# Patient Record
Sex: Female | Born: 1975 | Hispanic: No | Marital: Married | Smoking: Never smoker
Health system: Southern US, Community
[De-identification: ages and names within clinical notes are randomized; demographics above are authoritative.]

## PROBLEM LIST (undated history)

## (undated) DIAGNOSIS — D1779 Benign lipomatous neoplasm of other sites: Secondary | ICD-10-CM

## (undated) DIAGNOSIS — N760 Acute vaginitis: Secondary | ICD-10-CM

## (undated) DIAGNOSIS — M545 Low back pain, unspecified: Secondary | ICD-10-CM

## (undated) DIAGNOSIS — B9689 Other specified bacterial agents as the cause of diseases classified elsewhere: Secondary | ICD-10-CM

## (undated) HISTORY — DX: Benign lipomatous neoplasm of other sites: D17.79

## (undated) HISTORY — DX: Low back pain: M54.5

## (undated) HISTORY — DX: Low back pain, unspecified: M54.50

## (undated) HISTORY — PX: WISDOM TOOTH EXTRACTION: SHX21

## (undated) HISTORY — PX: DILATION AND CURETTAGE OF UTERUS: SHX78

---

## 2010-06-17 HISTORY — PX: OTHER SURGICAL HISTORY: SHX169

## 2012-01-28 ENCOUNTER — Encounter (INDEPENDENT_AMBULATORY_CARE_PROVIDER_SITE_OTHER): Payer: Self-pay | Admitting: Surgery

## 2012-02-04 ENCOUNTER — Ambulatory Visit (INDEPENDENT_AMBULATORY_CARE_PROVIDER_SITE_OTHER): Payer: BC Managed Care – PPO | Admitting: Surgery

## 2012-02-04 ENCOUNTER — Encounter (INDEPENDENT_AMBULATORY_CARE_PROVIDER_SITE_OTHER): Payer: Self-pay | Admitting: Surgery

## 2012-02-04 VITALS — BP 92/84 | HR 68 | Temp 98.8°F | Resp 14 | Ht 66.0 in | Wt 144.8 lb

## 2012-02-04 DIAGNOSIS — R222 Localized swelling, mass and lump, trunk: Secondary | ICD-10-CM

## 2012-02-04 DIAGNOSIS — R229 Localized swelling, mass and lump, unspecified: Secondary | ICD-10-CM

## 2012-02-04 NOTE — Progress Notes (Signed)
Patient ID: Tricia Davis, female   DOB: 1976-07-03, 36 y.o.   MRN: [id number]  Chief Complaint  Patient presents with  . Other    back mass    HPI Tricia Davis is a 36 y.o. female.  This is a pleasant female referred by Dr. Manus Gunning for evaluation of a mass on her left upper back. She reports no masses been there for almost 2 years but is now getting larger and causing him to have a significant amount of discomfort. It is difficult l mass secondary to its size. She is also having back pain from the mass. HPI  Past Medical History  Diagnosis Date  . Lipoma of other specified sites   . Lumbago     Past Surgical History  Procedure Date  . Cesarean section 02/09/2011  . Dnc 06/2010    Family History  Problem Relation Age of Onset  . Hypertension Mother     Social History History  Substance Use Topics  . Smoking status: Never Smoker   . Smokeless tobacco: Not on file  . Alcohol Use: No    No Known Allergies  Current Outpatient Prescriptions  Medication Sig Dispense Refill  . Multiple Vitamins-Minerals (MULTIVITAMIN WITH MINERALS) tablet Take 1 tablet by mouth daily.        Review of Systems Review of Systems  Constitutional: Negative for fever, chills and unexpected weight change.  HENT: Negative for hearing loss, congestion, sore throat, trouble swallowing and voice change.   Eyes: Negative for visual disturbance.  Respiratory: Negative for cough and wheezing.   Cardiovascular: Negative for chest pain, palpitations and leg swelling.  Gastrointestinal: Negative for nausea, vomiting, abdominal pain, diarrhea, constipation, blood in stool, abdominal distention and anal bleeding.  Genitourinary: Negative for hematuria, vaginal bleeding and difficulty urinating.  Musculoskeletal: Positive for back pain. Negative for arthralgias.  Skin: Negative for rash and wound.  Neurological: Negative for seizures, syncope and headaches.  Hematological: Negative for  adenopathy. Does not bruise/bleed easily.  Psychiatric/Behavioral: Negative for confusion.    Blood pressure 92/84, pulse 68, temperature 98.8 F (37.1 C), temperature source Temporal, resp. rate 14, height 5\' 6"  (1.676 m), weight 144 lb 12.8 oz (65.681 kg).  Physical Exam Physical Exam  Constitutional: She is oriented to person, place, and time. She appears well-developed and well-nourished. No distress.  HENT:  Head: Normocephalic and atraumatic.  Right Ear: External ear normal.  Left Ear: External ear normal.  Nose: Nose normal.  Mouth/Throat: Oropharynx is clear and moist. No oropharyngeal exudate.  Eyes: Conjunctivae are normal. Pupils are equal, round, and reactive to light. Right eye exhibits no discharge. Left eye exhibits no discharge.  Neck: Normal range of motion. Neck supple. No tracheal deviation present. No thyromegaly present.  Cardiovascular: Normal rate, regular rhythm, normal heart sounds and intact distal pulses.   No murmur heard. Pulmonary/Chest: Breath sounds normal. No respiratory distress. She has no wheezes.  Lymphadenopathy:    She has no cervical adenopathy.  Neurological: She is alert and oriented to person, place, and time.  Skin: Skin is warm and dry. She is not diaphoretic.       There is a 4 cm mass on her upper back to the left of the midline. It is soft and slightly mobile with no erythema. It is slightly tender  Psychiatric: Her behavior is normal. Judgment normal.    Data Reviewed   Assessment    A 4 cm back mass of uncertain etiology    Plan  Removal of the left upper back mass is recommended for both histologic evaluation and control of symptoms. This may represent a lipoma although a desmoid tumor or some other malignancy cannot be ruled out. I discussed this with her in detail. I discussed the risk of surgery which includes but is not limited to bleeding, infection, recurrence, seroma formation, etc. She understands and wishes to  proceed. Likelihood of success is good       Corwyn Vora A 02/04/2012, 4:25 PM

## 2012-02-18 ENCOUNTER — Ambulatory Visit (INDEPENDENT_AMBULATORY_CARE_PROVIDER_SITE_OTHER): Payer: Self-pay | Admitting: General Surgery

## 2012-02-19 ENCOUNTER — Ambulatory Visit (INDEPENDENT_AMBULATORY_CARE_PROVIDER_SITE_OTHER): Payer: Self-pay | Admitting: Surgery

## 2012-03-25 NOTE — H&P (Signed)
Tricia Davis is an 36 y.o. female. Presents for dilation and curettage for nonviable first trimester pregnancy.  Seen in the office where sono revealed a gestational sac c/w 9 weeks and 3 days.  No evidence of fetal pole or yolk sac noted.  History of 3 prior pregnancy loses.  Work up reveal uterine septum that was resected.  Pertinent Gynecological History: Mense DES exposure: denies Blood transfusions: none Sexually transmitted diseases: no past history Previous GYN Procedures: DNC    OB History: G4, P1   Menstrual History: Menarche age: unknown LMP 01/28/12    Past Medical History  Diagnosis Date  . Lipoma of other specified sites   . Lumbago     Past Surgical History  Procedure Date  . Cesarean section 02/09/2011  . Dnc 06/2010    Family History  Problem Relation Age of Onset  . Hypertension Mother     Social History:  reports that she has never smoked. She does not have any smokeless tobacco history on file. She reports that she does not drink alcohol or use illicit drugs.  Allergies: No Known Allergies  No prescriptions prior to admission    Review of Systems  All other systems reviewed and are negative.    There were no vitals taken for this visit. Physical Exam  Constitutional: She is oriented to person, place, and time. She appears well-developed and well-nourished.  HENT:  Head: Normocephalic.  Eyes: Conjunctivae and EOM are normal. Pupils are equal, round, and reactive to light.  Neck: Normal range of motion. Neck supple.  Cardiovascular: Normal rate, regular rhythm and normal heart sounds.   Respiratory: Effort normal and breath sounds normal.  GI: Soft. Bowel sounds are normal.  Genitourinary:       Uterus c/w 9 weeks brownish discharge noted  Musculoskeletal: Normal range of motion.  Neurological: She is alert and oriented to person, place, and time.    No results found for this or any previous visit (from the past 24 hour(s)).  No  results found.  Assessment/Plan: Nonviable first trimester pregnancy procede with dilation and curettage.  Risk discussed.  Including infection.  Hemorrhage that could require transfusions with risks of aids and hepatitis.  Excessive bleeding could require hysterectomy.  Risk of perforation with injury to adjacent organs requiring exploratory surgery.  Risks of DVT and PE>  Aram Domzalski S 03/25/2012, 6:55 AM

## 2012-03-28 ENCOUNTER — Ambulatory Visit (HOSPITAL_COMMUNITY): Payer: BC Managed Care – PPO

## 2012-03-28 ENCOUNTER — Ambulatory Visit (HOSPITAL_COMMUNITY)
Admission: RE | Admit: 2012-03-28 | Discharge: 2012-03-28 | Disposition: A | Discharge status: Home / Self Care | Payer: BC Managed Care – PPO | Source: Ambulatory Visit | Attending: Obstetrics and Gynecology | Admitting: Obstetrics and Gynecology

## 2012-03-28 ENCOUNTER — Encounter (HOSPITAL_COMMUNITY): Payer: Self-pay | Admitting: *Deleted

## 2012-03-28 ENCOUNTER — Encounter (HOSPITAL_COMMUNITY)
Admission: RE | Disposition: A | Discharge status: Home / Self Care | Payer: Self-pay | Source: Ambulatory Visit | Attending: Obstetrics and Gynecology

## 2012-03-28 ENCOUNTER — Encounter (HOSPITAL_COMMUNITY): Payer: Self-pay | Admitting: Registered Nurse

## 2012-03-28 ENCOUNTER — Ambulatory Visit (HOSPITAL_COMMUNITY): Payer: BC Managed Care – PPO | Admitting: Registered Nurse

## 2012-03-28 DIAGNOSIS — R222 Localized swelling, mass and lump, trunk: Secondary | ICD-10-CM

## 2012-03-28 DIAGNOSIS — O034 Incomplete spontaneous abortion without complication: Secondary | ICD-10-CM | POA: Insufficient documentation

## 2012-03-28 DIAGNOSIS — O020 Blighted ovum and nonhydatidiform mole: Secondary | ICD-10-CM

## 2012-03-28 HISTORY — PX: DILATION AND EVACUATION: SHX1459

## 2012-03-28 LAB — CBC
Platelets: 201 10*3/uL (ref 150–400)
RBC: 4.25 MIL/uL (ref 3.87–5.11)
WBC: 9.1 10*3/uL (ref 4.0–10.5)

## 2012-03-28 LAB — ABO/RH: ABO/RH(D): A POS

## 2012-03-28 IMAGING — US US OB COMP LESS 14 WK
1 series · 14 of 28 positions shown · non-contrast
Comparison: none

[Series 1: us ob transvaginal · 39 acquisitions, 14 frames shown]
[im 2/39]
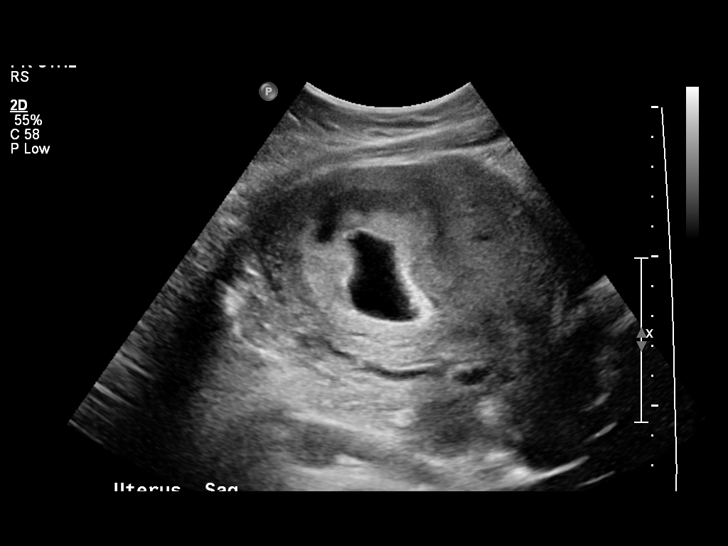
[im 5/39]
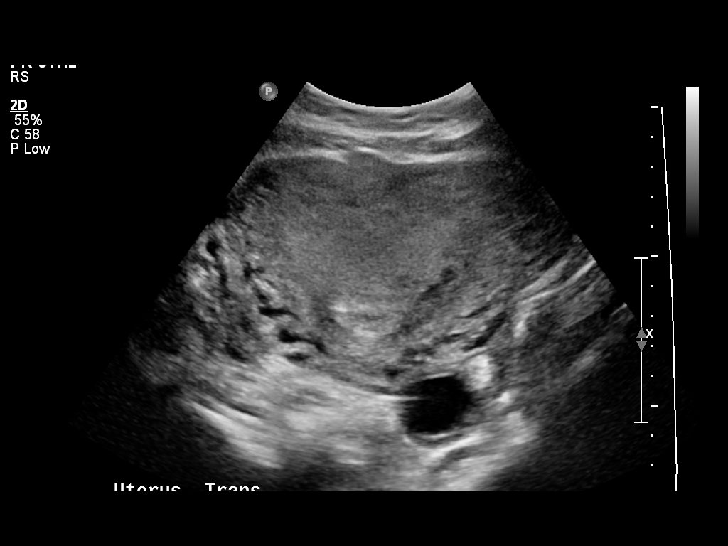
[im 8/39]
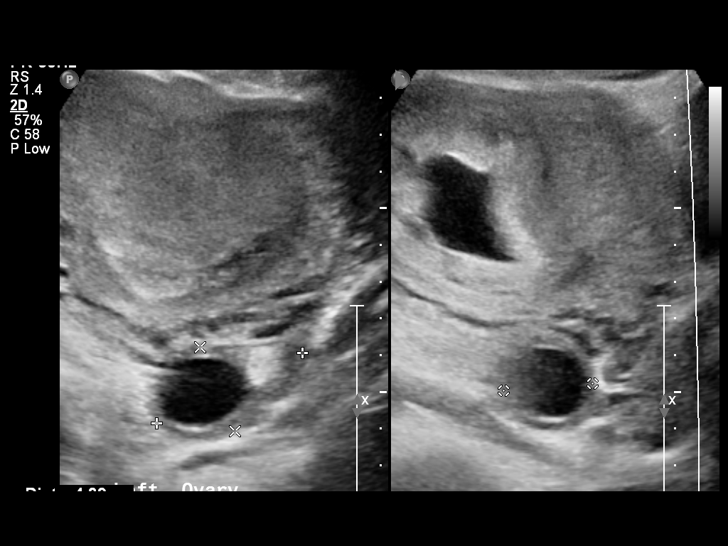
[im 10/39]
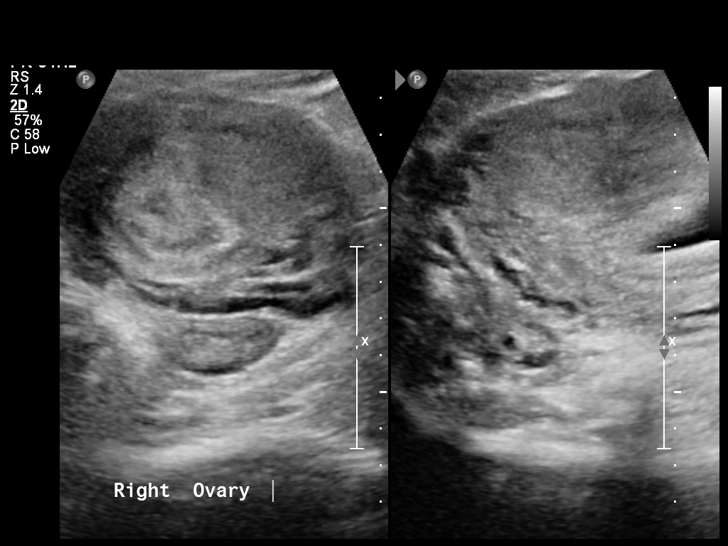
[im 13/39]
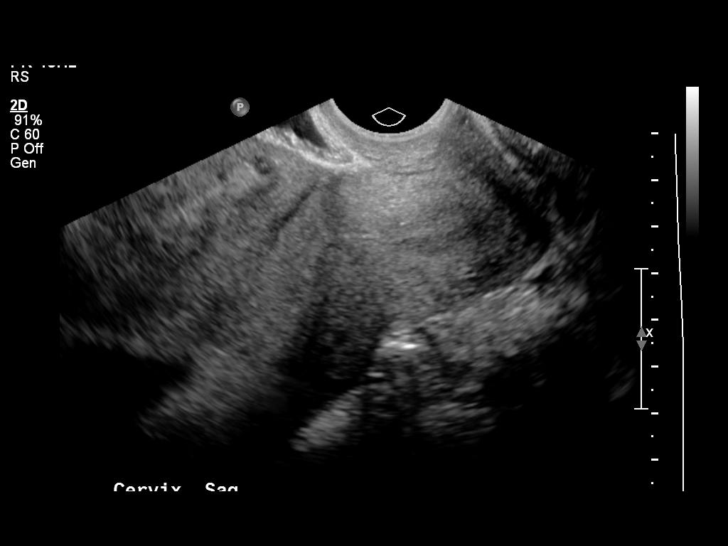
[im 16/39]
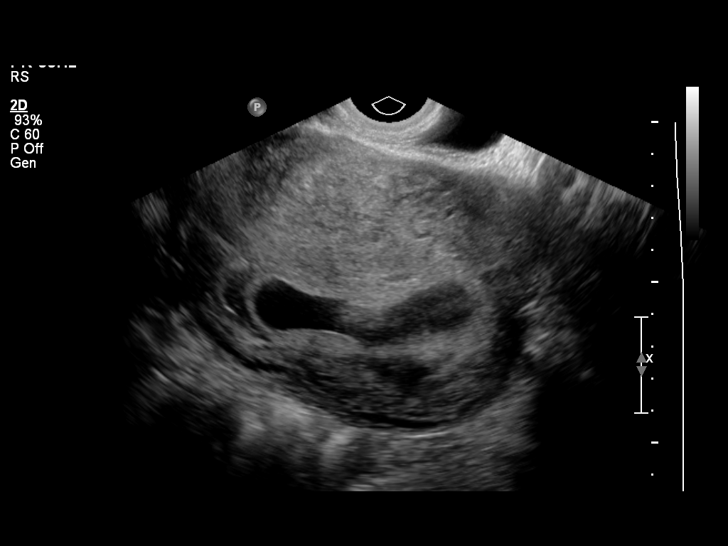
[im 19/39]
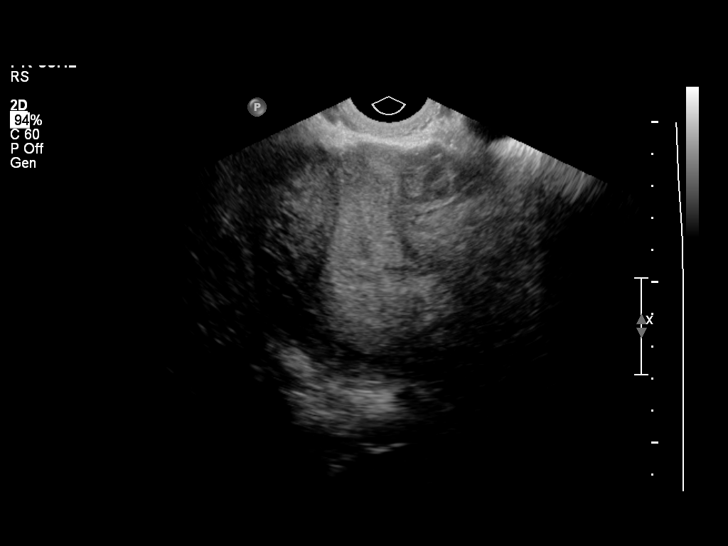
[im 22/39]
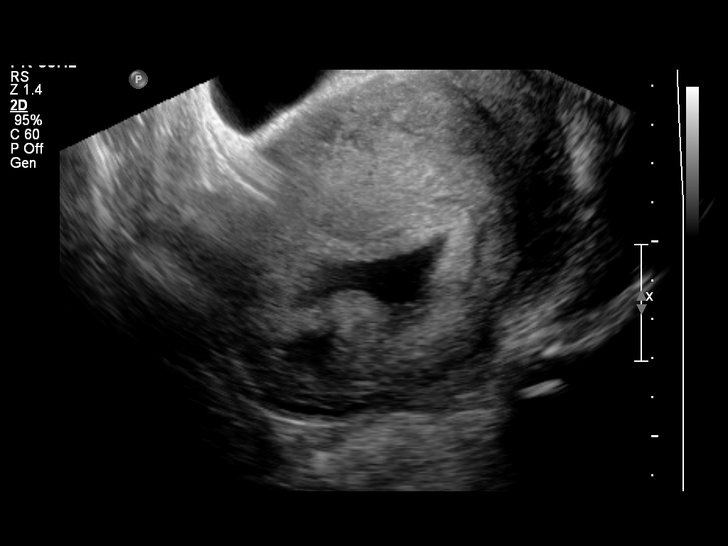
[im 24/39]
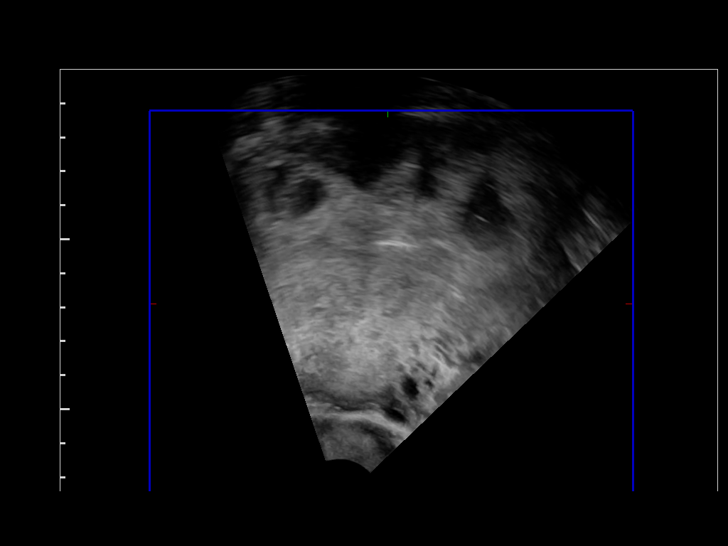
[im 27/39]
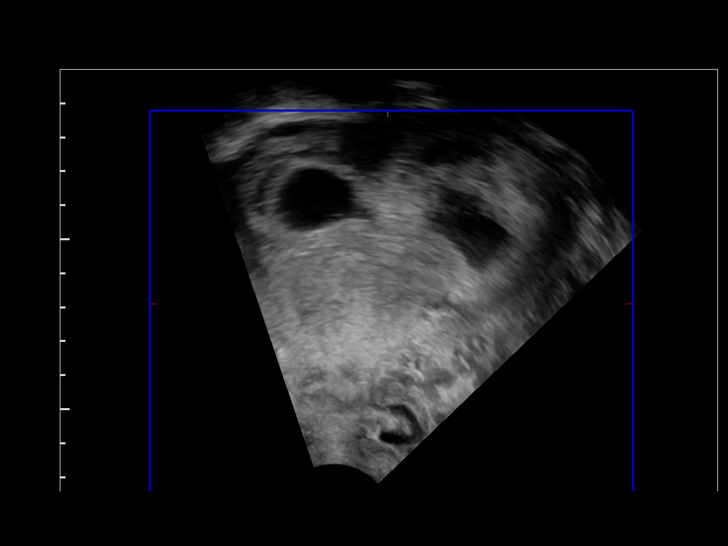
[im 30/39]
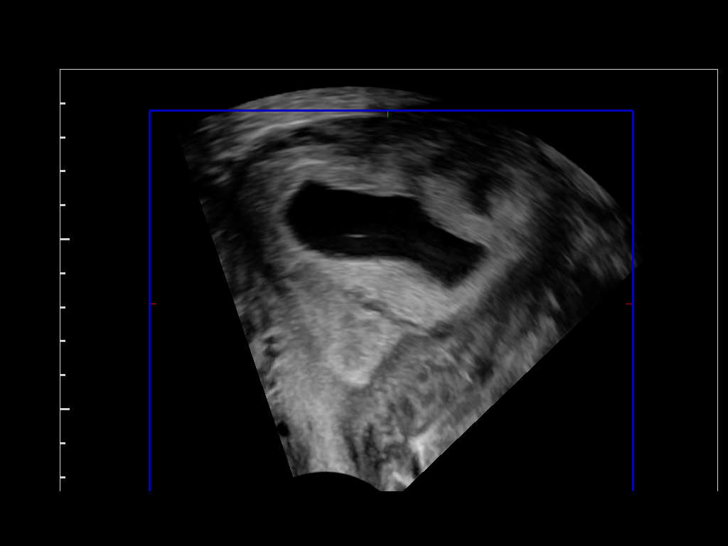
[im 33/39]
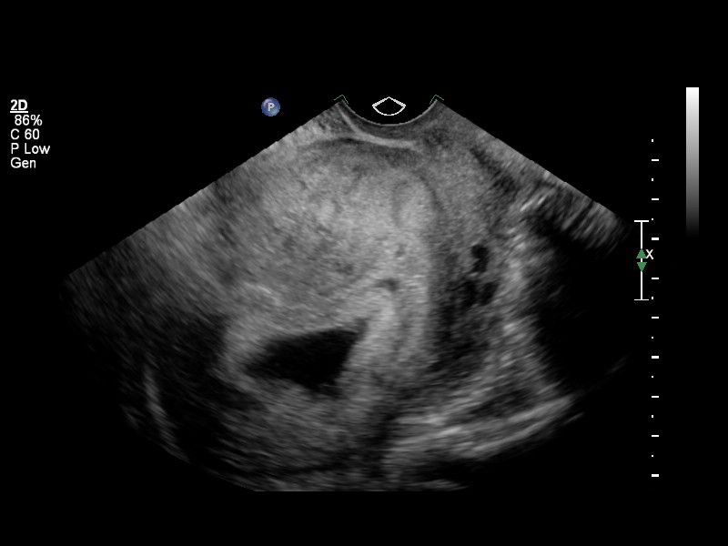
[im 36/39]
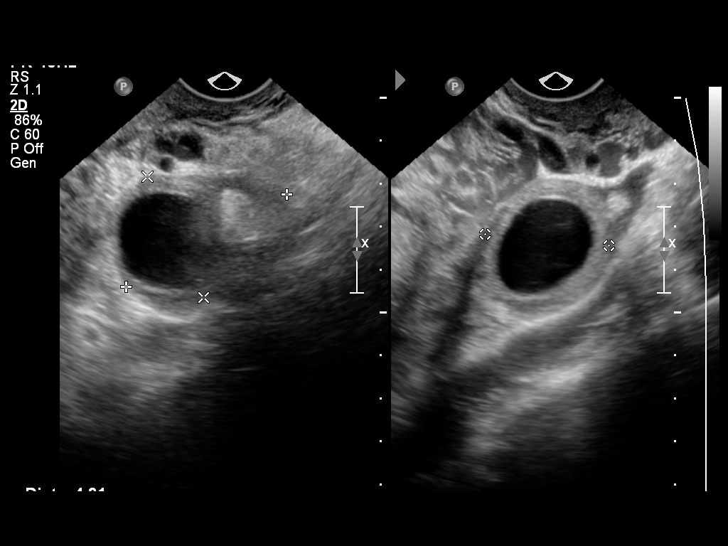
[im 39/39]
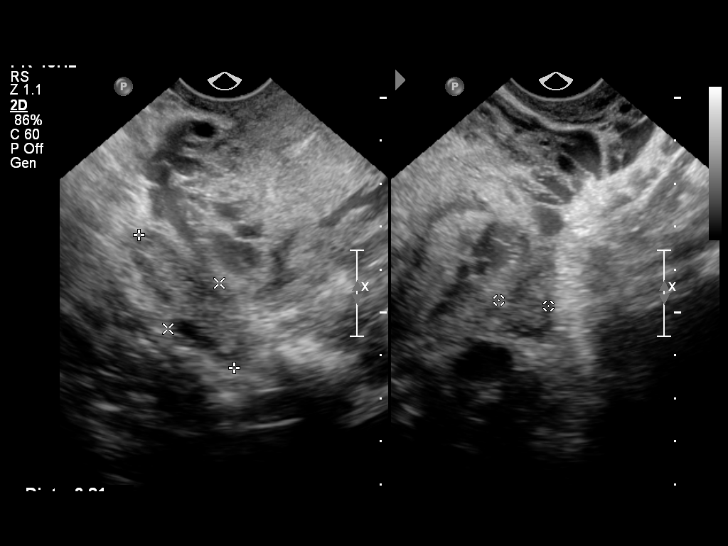

[14 of 28 positions shown; findings below may reference images not displayed]

OBSTETRICS REPORT
                      (Signed Final [DATE] [DATE])

 Order#:         [PHONE_NUMBER]_O,[4O]
                 36_O
Procedures

 US OB TRANSVAGINAL                                    76817.0
 US OB COMP LESS 14 WKS                                76801.0
Indications

 Pregnancy with inconclusive fetal viability           [4O]
 Previous cesarean section
Fetal Evaluation

 Preg. Location:    Intrauterine
 Gest. Sac:         Intrauterine
 Yolk Sac:          Not visualized
 Fetal Pole:        Not visualized
 Cardiac Activity:  No embryo visualized
Biometry

 GS:        33  mm    G. Age:   9w 1d                  EDD:   [DATE]
Cervix Uterus Adnexa

 Cervix:       Normal appearance by transvaginal scan
 Uterus:       Irregular IUGS seen.
 Cul De Sac:   No free fluid seen.

 Left Ovary:   Small echogenic focus, probable small dermoid.
 Right Ovary:  Within normal limits.
 Adnexa:     No abnormality visualized.
Impression

 Irregular gestational sac with small amount of internal debris
 but no yolk sac or fetal pole with MSD of 33mm. Findings are
 confirmatory for a blighted ovum.

 Probable small left ovarian dermoid.

## 2012-03-28 SURGERY — DILATION AND EVACUATION, UTERUS
Anesthesia: Monitor Anesthesia Care | Site: Uterus | Wound class: Clean Contaminated

## 2012-03-28 MED ORDER — FENTANYL CITRATE 0.05 MG/ML IJ SOLN
INTRAMUSCULAR | Status: DC | PRN
  Administered 2012-03-28: 100 ug via INTRAVENOUS

## 2012-03-28 MED ORDER — LACTATED RINGERS IV SOLN
INTRAVENOUS | Status: DC | PRN
  Administered 2012-03-28 (×2): via INTRAVENOUS

## 2012-03-28 MED ORDER — DEXAMETHASONE SODIUM PHOSPHATE 10 MG/ML IJ SOLN
INTRAMUSCULAR | Status: AC
  Filled 2012-03-28: qty 1

## 2012-03-28 MED ORDER — FENTANYL CITRATE 0.05 MG/ML IJ SOLN
INTRAMUSCULAR | Status: AC
  Filled 2012-03-28: qty 2

## 2012-03-28 MED ORDER — MIDAZOLAM HCL 2 MG/2ML IJ SOLN: 0.5000 mg | Freq: Once | INTRAMUSCULAR | Status: DC | PRN

## 2012-03-28 MED ORDER — CEFAZOLIN SODIUM-DEXTROSE 2-3 GM-% IV SOLR
INTRAVENOUS | Status: AC
  Administered 2012-03-28: 2 g via INTRAVENOUS
  Filled 2012-03-28: qty 50

## 2012-03-28 MED ORDER — PROPOFOL 10 MG/ML IV EMUL
INTRAVENOUS | Status: DC | PRN
  Administered 2012-03-28: 100 ug/kg/min via INTRAVENOUS

## 2012-03-28 MED ORDER — FENTANYL CITRATE 0.05 MG/ML IJ SOLN: 25.0000 ug | INTRAMUSCULAR | Status: DC | PRN

## 2012-03-28 MED ORDER — LIDOCAINE HCL (CARDIAC) 20 MG/ML IV SOLN
INTRAVENOUS | Status: DC | PRN
  Administered 2012-03-28: 60 mg via INTRAVENOUS

## 2012-03-28 MED ORDER — KETOROLAC TROMETHAMINE 30 MG/ML IJ SOLN: 15.0000 mg | Freq: Once | INTRAMUSCULAR | Status: DC | PRN

## 2012-03-28 MED ORDER — CEFAZOLIN SODIUM-DEXTROSE 2-3 GM-% IV SOLR: 2.0000 g | INTRAVENOUS | Status: DC

## 2012-03-28 MED ORDER — CHLOROPROCAINE HCL 1 % IJ SOLN
INTRAMUSCULAR | Status: AC
  Filled 2012-03-28: qty 30

## 2012-03-28 MED ORDER — ONDANSETRON HCL 4 MG/2ML IJ SOLN: 4.0000 mg | Freq: Once | INTRAMUSCULAR | Status: DC

## 2012-03-28 MED ORDER — MIDAZOLAM HCL 2 MG/2ML IJ SOLN
INTRAMUSCULAR | Status: AC
  Filled 2012-03-28: qty 2

## 2012-03-28 MED ORDER — LACTATED RINGERS IV SOLN: INTRAVENOUS | Status: DC

## 2012-03-28 MED ORDER — ACETAMINOPHEN 325 MG PO TABS: 325.0000 mg | ORAL_TABLET | ORAL | Status: DC | PRN

## 2012-03-28 MED ORDER — ONDANSETRON HCL 4 MG/2ML IJ SOLN
INTRAMUSCULAR | Status: AC
  Administered 2012-03-28: 4 mg via INTRAVENOUS
  Filled 2012-03-28: qty 2

## 2012-03-28 MED ORDER — KETOROLAC TROMETHAMINE 30 MG/ML IJ SOLN
INTRAMUSCULAR | Status: DC | PRN
  Administered 2012-03-28: 30 mg via INTRAVENOUS

## 2012-03-28 MED ORDER — MEPERIDINE HCL 25 MG/ML IJ SOLN: 6.2500 mg | INTRAMUSCULAR | Status: DC | PRN

## 2012-03-28 MED ORDER — GLYCOPYRROLATE 0.2 MG/ML IJ SOLN
INTRAMUSCULAR | Status: AC
  Filled 2012-03-28: qty 1

## 2012-03-28 MED ORDER — PROPOFOL 10 MG/ML IV EMUL
INTRAVENOUS | Status: AC
  Filled 2012-03-28: qty 40

## 2012-03-28 MED ORDER — DEXAMETHASONE SODIUM PHOSPHATE 10 MG/ML IJ SOLN
INTRAMUSCULAR | Status: DC | PRN
  Administered 2012-03-28: 10 mg via INTRAVENOUS

## 2012-03-28 MED ORDER — PROMETHAZINE HCL 25 MG/ML IJ SOLN: 6.2500 mg | INTRAMUSCULAR | Status: DC | PRN

## 2012-03-28 MED ORDER — ONDANSETRON HCL 4 MG/2ML IJ SOLN
INTRAMUSCULAR | Status: DC | PRN
  Administered 2012-03-28: 4 mg via INTRAVENOUS

## 2012-03-28 MED ORDER — ALBUTEROL SULFATE HFA 108 (90 BASE) MCG/ACT IN AERS
INHALATION_SPRAY | RESPIRATORY_TRACT | Status: AC
  Filled 2012-03-28: qty 6.7

## 2012-03-28 MED ORDER — LACTATED RINGERS IV SOLN
INTRAVENOUS | Status: DC
  Administered 2012-03-28: 125 mL/h via INTRAVENOUS

## 2012-03-28 MED ORDER — PROPOFOL 10 MG/ML IV EMUL
INTRAVENOUS | Status: DC | PRN
  Administered 2012-03-28: 30 mg via INTRAVENOUS
  Administered 2012-03-28: 20 mg via INTRAVENOUS
  Administered 2012-03-28: 30 mg via INTRAVENOUS

## 2012-03-28 MED ORDER — ONDANSETRON HCL 4 MG/2ML IJ SOLN
INTRAMUSCULAR | Status: AC
  Filled 2012-03-28: qty 2

## 2012-03-28 MED ORDER — MIDAZOLAM HCL 5 MG/5ML IJ SOLN
INTRAMUSCULAR | Status: DC | PRN
  Administered 2012-03-28: 2 mg via INTRAVENOUS

## 2012-03-28 MED ORDER — CHLOROPROCAINE HCL 1 % IJ SOLN
INTRAMUSCULAR | Status: DC | PRN
  Administered 2012-03-28: 30 mL

## 2012-03-28 MED ORDER — LIDOCAINE HCL (CARDIAC) 20 MG/ML IV SOLN
INTRAVENOUS | Status: AC
  Filled 2012-03-28: qty 5

## 2012-03-28 SURGICAL SUPPLY — 17 items
CLOTH BEACON ORANGE TIMEOUT ST (SAFETY) ×2 IMPLANT
DECANTER SPIKE VIAL GLASS SM (MISCELLANEOUS) ×2 IMPLANT
GLOVE BIO SURGEON STRL SZ7 (GLOVE) ×2 IMPLANT
GOWN PREVENTION PLUS LG XLONG (DISPOSABLE) ×2 IMPLANT
GOWN STRL REIN XL XLG (GOWN DISPOSABLE) ×2 IMPLANT
KIT BERKELEY 1ST TRIMESTER 3/8 (MISCELLANEOUS) ×2 IMPLANT
NEEDLE SPNL 20GX3.5 QUINCKE YW (NEEDLE) ×2 IMPLANT
NS IRRIG 1000ML POUR BTL (IV SOLUTION) ×2 IMPLANT
PACK VAGINAL MINOR WOMEN LF (CUSTOM PROCEDURE TRAY) ×2 IMPLANT
PAD PREP 24X48 CUFFED NSTRL (MISCELLANEOUS) ×2 IMPLANT
SET BERKELEY SUCTION TUBING (SUCTIONS) ×2 IMPLANT
SYR CONTROL 10ML LL (SYRINGE) ×2 IMPLANT
TOWEL OR 17X24 6PK STRL BLUE (TOWEL DISPOSABLE) ×4 IMPLANT
VACURETTE 10 RIGID CVD (CANNULA) IMPLANT
VACURETTE 7MM CVD STRL WRAP (CANNULA) IMPLANT
VACURETTE 8 RIGID CVD (CANNULA) ×2 IMPLANT
VACURETTE 9 RIGID CVD (CANNULA) IMPLANT

## 2012-03-28 NOTE — Transfer of Care (Signed)
Immediate Anesthesia Transfer of Care Note  Patient: Tricia Davis  Procedure(s) Performed: Procedure(s) (LRB): DILATATION AND EVACUATION (N/A)  Patient Location: PACU  Anesthesia Type: MAC  Level of Consciousness: awake, alert  and oriented  Airway & Oxygen Therapy: Patient Spontanous Breathing and Patient connected to nasal cannula oxygen  Post-op Assessment: Report given to PACU RN and Post -op Vital signs reviewed and stable  Post vital signs: Reviewed and stable  Complications: No apparent anesthesia complications

## 2012-03-28 NOTE — Brief Op Note (Signed)
03/28/2012  8:25 AM  PATIENT:  Tricia Davis  36 y.o. female  PRE-OPERATIVE DIAGNOSIS:  non viable first trimester pregnancy  POST-OPERATIVE DIAGNOSIS:  non viable first trimester pregnancy  PROCEDURE:  Procedure(s) (LRB): DILATATION AND EVACUATION (N/A)  SURGEON:  Surgeon(s) and Role:    * Juluis Mire, MD - Primary  PHYSICIAN ASSISTANT:   ASSISTANTS: none   ANESTHESIA:   local and IV sedation  EBL:  Total I/O In: 1000 [I.V.:1000] Out: 250 [Blood:250]  BLOOD ADMINISTERED:none  DRAINS: none   LOCAL MEDICATIONS USED:  Amount: 20 ml and OTHER nesicaine 1 %  SPECIMEN:  Source of Specimen:  poc  DISPOSITION OF SPECIMEN:  PATHOLOGY  COUNTS:  YES  TOURNIQUET:  * No tourniquets in log *  DICTATION: .Other Dictation: Dictation Number [id number]  PLAN OF CARE: Discharge to home after PACU  PATIENT DISPOSITION:  PACU - hemodynamically stable.   Delay start of Pharmacological VTE agent (>24hrs) due to surgical blood loss or risk of bleeding: no

## 2012-03-28 NOTE — Op Note (Signed)
Patient name Tricia Davis, Tricia Davis DICTATION#  [id number] CSN# [id number]  [facility], MD 03/28/2012 8:26 AM

## 2012-03-28 NOTE — Anesthesia Preprocedure Evaluation (Addendum)
Anesthesia Evaluation  Patient identified by MRN, date of birth, ID band Patient awake    Reviewed: Allergy & Precautions, H&P , Patient's Chart, lab work & pertinent test results, reviewed documented beta blocker date and time   History of Anesthesia Complications Negative for: history of anesthetic complications  Airway Mallampati: II TM Distance: >3 FB Neck ROM: full    Dental No notable dental hx.    Pulmonary neg pulmonary ROS,  breath sounds clear to auscultation  Pulmonary exam normal       Cardiovascular Exercise Tolerance: Good negative cardio ROS  Rhythm:regular Rate:Normal     Neuro/Psych negative neurological ROS  negative psych ROS   GI/Hepatic negative GI ROS, Neg liver ROS,   Endo/Other  negative endocrine ROS  Renal/GU negative Renal ROS     Musculoskeletal   Abdominal   Peds  Hematology negative hematology ROS (+)   Anesthesia Other Findings   Reproductive/Obstetrics negative OB ROS                           Anesthesia Physical Anesthesia Plan  ASA: I  Anesthesia Plan: MAC   Post-op Pain Management:    Induction:   Airway Management Planned:   Additional Equipment:   Intra-op Plan:   Post-operative Plan:   Informed Consent: I have reviewed the patients History and Physical, chart, labs and discussed the procedure including the risks, benefits and alternatives for the proposed anesthesia with the patient or authorized representative who has indicated his/her understanding and acceptance.   Dental Advisory Given  Plan Discussed with: CRNA, Surgeon and Anesthesiologist  Anesthesia Plan Comments:       Anesthesia Quick Evaluation  

## 2012-03-28 NOTE — Op Note (Signed)
NAME:  Tricia Davis, Tricia Davis NO.:  [id number]  MEDICAL RECORD NO.:  [id number]  LOCATION:  [facility]                          FACILITY:  [facility]  PHYSICIAN:  Juluis Mire, M.D.   DATE OF BIRTH:  01/15/1976  DATE OF PROCEDURE:  03/28/2012 DATE OF DISCHARGE:                              OPERATIVE REPORT   PREOPERATIVE DIAGNOSIS:  Nonviable first trimester pregnancy.  POSTOP DIAGNOSIS:  Nonviable first trimester pregnancy.  OPERATIVE PROCEDURE:  Paracervical block.  Dilatation and evacuation.  SURGEON:  Juluis Mire, MD  ANESTHESIA:  Paracervical block and sedation.  ESTIMATED BLOOD LOSS:  100-200 mL.  PACKS AND DRAINS:  None.  INTRAOPERATIVE BLOOD PLACED:  None.  COMPLICATIONS:  None.  INDICATION:  Dictated in history and physical.  PROCEDURE:  The patient was taken to OR, placed in supine position. After sedation, the patient was placed in dorsal lithotomy position using the Allen stirrups.  The patient was draped in sterile field.  A speculum was placed in the vaginal vault.  The cervix and vagina cleansed out with Betadine.  Anterior lip of the cervix was anesthetized with 1% Xylocaine, secured with a single-tooth tenaculum.  A 1% Nesacaine was used to perform a paracervical block, 20 mL were used. Uterus sounded approximately 11 cm.  Cervix was serially dilated to a size 29 Pratt dilator.  A size 8 curved suction curette was then introduced and intrauterine contents were evacuated using suction curetting.  This was continued until no further tissue was obtained.  We then sharply curetted, felt some smoothness, particularly to the left side.  Repeat suction curetting several times followed by sharp curetting till the uterus was contracting down well and we felt the uterus was emptied by the gritty feel in all quadrants.  There was no active bleeding.  Tissue was obtained and sent for genetic studies.  Speculum and single-tooth tenaculum removed.  The patient  taken out of the dorsal lithotomy position.  Once alert, transferred to recovery room in good condition.  Sponge, instrument, and needle count was correct by circulating nurse.     Juluis Mire, M.D.     JSM/MEDQ  D:  03/28/2012  T:  03/28/2012  Job:  [id number]

## 2012-03-28 NOTE — Anesthesia Postprocedure Evaluation (Signed)
  Anesthesia Post-op Note  Patient: Tricia Davis  Procedure(s) Performed: Procedure(s) (LRB): DILATATION AND EVACUATION (N/A)  Patient Location: PACU  Anesthesia Type: MAC  Level of Consciousness: awake, alert  and oriented  Airway and Oxygen Therapy: Patient Spontanous Breathing  Post-op Pain: none  Post-op Assessment: Post-op Vital signs reviewed, Patient's Cardiovascular Status Stable, Respiratory Function Stable, Patent Airway, No signs of Nausea or vomiting and Pain level controlled  Post-op Vital Signs: Reviewed and stable  Complications: No apparent anesthesia complications

## 2012-03-28 NOTE — H&P (Signed)
  History and physical exam unchanged 

## 2012-03-28 NOTE — Progress Notes (Signed)
Patient requested an ultrasound to be done prior to surgery.  Dr Arelia Sneddon called and obtained u/s order.  Patient left SS to U/S. Pt out of SS Dept.  Dr Sheral Apley, Juliane Lack, CNA, Kathi Ludwig RN all notified of U/S prior to surgery.

## 2012-03-31 ENCOUNTER — Encounter (HOSPITAL_COMMUNITY): Payer: Self-pay | Admitting: Obstetrics and Gynecology

## 2012-04-18 LAB — CHROMOSOME STD, POC(TISSUE)-NCBH

## 2012-09-15 ENCOUNTER — Ambulatory Visit (INDEPENDENT_AMBULATORY_CARE_PROVIDER_SITE_OTHER): Payer: BC Managed Care – PPO | Admitting: General Surgery

## 2012-09-15 ENCOUNTER — Encounter (INDEPENDENT_AMBULATORY_CARE_PROVIDER_SITE_OTHER): Payer: Self-pay | Admitting: General Surgery

## 2012-09-15 VITALS — BP 98/56 | HR 64 | Temp 98.0°F | Resp 16 | Ht 66.0 in | Wt 141.2 lb

## 2012-09-15 DIAGNOSIS — R229 Localized swelling, mass and lump, unspecified: Secondary | ICD-10-CM

## 2012-09-15 DIAGNOSIS — R222 Localized swelling, mass and lump, trunk: Secondary | ICD-10-CM

## 2012-09-15 NOTE — Progress Notes (Signed)
Subjective:     Patient ID: Tricia Davis, female   DOB: 07/25/1976, 36 y.o.   MRN: [id number]  HPI  She is here to discuss removal of a left back soft tissue mass. She saw Dr. Blackman in May of this year but became pregnant after that time and had to postpone the surgery. She is now interested in having surgery.   Review of Systems  She thinks the mass is continuing to get larger the     Objective:   Physical Exam Gen.-well-developed, well-nourished, in no acute distress.  Musculoskeletal-5.5 cm subcutaneous soft tissue mass and left upper back is present. No erythema or ecchymosis. Small scar present in the middle of the skin area.    Assessment:     Enlarging subcutaneous soft tissue mass of left upper back.    Plan:     Removal of mass under local anesthesia. We discussed the procedure, risks, and aftercare. Risks include but are not limited to bleeding, infection, wound healing problems, reaction to anesthesia. She seems to understand this and agrees with the plan.      

## 2012-09-15 NOTE — Patient Instructions (Signed)
Light activities with left arm for one week after surgery.

## 2012-09-24 ENCOUNTER — Other Ambulatory Visit: Payer: Self-pay | Admitting: Family Medicine

## 2012-09-24 DIAGNOSIS — N644 Mastodynia: Secondary | ICD-10-CM

## 2012-09-25 ENCOUNTER — Encounter (HOSPITAL_BASED_OUTPATIENT_CLINIC_OR_DEPARTMENT_OTHER)
Admission: RE | Disposition: A | Discharge status: Home / Self Care | Payer: Self-pay | Source: Ambulatory Visit | Attending: General Surgery

## 2012-09-25 ENCOUNTER — Ambulatory Visit (HOSPITAL_BASED_OUTPATIENT_CLINIC_OR_DEPARTMENT_OTHER)
Admission: RE | Admit: 2012-09-25 | Discharge: 2012-09-25 | Disposition: A | Discharge status: Home / Self Care | Payer: BC Managed Care – PPO | Source: Ambulatory Visit | Attending: General Surgery | Admitting: General Surgery

## 2012-09-25 DIAGNOSIS — D1739 Benign lipomatous neoplasm of skin and subcutaneous tissue of other sites: Secondary | ICD-10-CM

## 2012-09-25 DIAGNOSIS — M799 Soft tissue disorder, unspecified: Secondary | ICD-10-CM | POA: Insufficient documentation

## 2012-09-25 HISTORY — PX: MASS EXCISION: SHX2000

## 2012-09-25 SURGERY — MINOR EXCISION OF MASS
Anesthesia: LOCAL | Site: Back | Wound class: Clean

## 2012-09-25 MED ORDER — HYDROCODONE-ACETAMINOPHEN 5-325 MG PO TABS: 1.0000 | ORAL_TABLET | Freq: Four times a day (QID) | ORAL | Status: DC | PRN

## 2012-09-25 MED ORDER — SODIUM BICARBONATE 4 % IV SOLN
INTRAVENOUS | Status: DC | PRN
  Administered 2012-09-25: 11:00:00

## 2012-09-25 SURGICAL SUPPLY — 23 items
BENZOIN TINCTURE PRP APPL 2/3 (GAUZE/BANDAGES/DRESSINGS) ×2 IMPLANT
BLADE SURG 10 STRL SS (BLADE) IMPLANT
BLADE SURG 15 STRL LF DISP TIS (BLADE) ×1 IMPLANT
BLADE SURG 15 STRL SS (BLADE) ×1
CHLORAPREP W/TINT 26ML (MISCELLANEOUS) ×4 IMPLANT
CLOTH BEACON ORANGE TIMEOUT ST (SAFETY) ×2 IMPLANT
DRSG TEGADERM 4X4.75 (GAUZE/BANDAGES/DRESSINGS) ×2 IMPLANT
ELECT COATED BLADE 2.86 ST (ELECTRODE) IMPLANT
ELECT REM PT RETURN 9FT ADLT (ELECTROSURGICAL) ×2
ELECTRODE REM PT RTRN 9FT ADLT (ELECTROSURGICAL) ×1 IMPLANT
GAUZE SPONGE 4X4 12PLY STRL LF (GAUZE/BANDAGES/DRESSINGS) ×2 IMPLANT
GLOVE BIO SURGEON STRL SZ8 (GLOVE) ×2 IMPLANT
GLOVE BIOGEL PI IND STRL 8.5 (GLOVE) ×2 IMPLANT
GLOVE BIOGEL PI INDICATOR 8.5 (GLOVE) ×2
GLOVE ECLIPSE 8.0 STRL XLNG CF (GLOVE) ×4 IMPLANT
GOWN PREVENTION PLUS XLARGE (GOWN DISPOSABLE) ×2 IMPLANT
NEEDLE HYPO 25X1 1.5 SAFETY (NEEDLE) ×2 IMPLANT
PENCIL BUTTON HOLSTER BLD 10FT (ELECTRODE) IMPLANT
SPONGE GAUZE 4X4 12PLY (GAUZE/BANDAGES/DRESSINGS) IMPLANT
STRIP CLOSURE SKIN 1/2X4 (GAUZE/BANDAGES/DRESSINGS) ×2 IMPLANT
SUT MON AB 4-0 PC3 18 (SUTURE) ×2 IMPLANT
SUT VICRYL 3-0 CR8 SH (SUTURE) ×2 IMPLANT
SYR CONTROL 10ML LL (SYRINGE) ×2 IMPLANT

## 2012-09-25 NOTE — Interval H&P Note (Signed)
History and Physical Interval Note:  09/25/2012 9:58 AM  Tricia Davis  has presented today for surgery, with the diagnosis of soft tissue mass on back  The various methods of treatment have been discussed with the patient and family. After consideration of risks, benefits and other options for treatment, the patient has consented to  Procedure(s) (LRB) with comments: MINOR EXCISION OF MASS (N/A) - remove soft tissue mass on back 5.5 cm as a surgical intervention .  The patient's history has been reviewed, patient examined, no change in status, stable for surgery.  I have reviewed the patient's chart and labs.  Questions were answered to the patient's satisfaction.     Rajohn Henery [facility]

## 2012-09-25 NOTE — H&P (View-Only) (Signed)
Subjective:     Patient ID: Tricia Davis, female   DOB: Feb 09, 1976, 37 y.o.   MRN: [id number]  HPI  She is here to discuss removal of a left back soft tissue mass. She saw Dr. Magnus Ivan in May of this year but became pregnant after that time and had to postpone the surgery. She is now interested in having surgery.   Review of Systems  She thinks the mass is continuing to get larger the     Objective:   Physical Exam Gen.-well-developed, well-nourished, in no acute distress.  Musculoskeletal-5.5 cm subcutaneous soft tissue mass and left upper back is present. No erythema or ecchymosis. Small scar present in the middle of the skin area.    Assessment:     Enlarging subcutaneous soft tissue mass of left upper back.    Plan:     Removal of mass under local anesthesia. We discussed the procedure, risks, and aftercare. Risks include but are not limited to bleeding, infection, wound healing problems, reaction to anesthesia. She seems to understand this and agrees with the plan.

## 2012-09-25 NOTE — Op Note (Signed)
Operative Note  Tricia Davis female 37 y.o. 09/25/2012  PREOPERATIVE DX:  Soft tissue mass left upper back  POSTOPERATIVE DX:  Same  PROCEDURE:  Excision of 6.5 cm subfascial soft tissue mass left upper back         Surgeon: Adolph Pollack   Assistants: None  Anesthesia: Local anesthesia 0.5% bupivacaine and 1% Xylocaine  Indications:   This is a 37 year old female with an enlarging soft tissue mass in the left upper back. She now presents for removal. The procedure, risks, and after care were discussed with her preoperatively.    Procedure Detail:  She was seen in the holding area. She is brought to the operating room and placed prone on the operating table. The left upper back and mid back areas were sterilely prepped and draped. Local anesthetic was infiltrated directly over the mass in the subdermal and subcutaneous tissues. A transverse incision was made through the skin and subcutaneous tissue. A small incision was made in the fascia and underlying mass grossly consistent with lipoma was identified. Using blunt and sharp dissection it was removed and it measured  6.5 cm.  It was sent to pathology.  The wound was inspected and hemostasis was noted to be adequate. The fascia and subcutaneous tissues were closed in 2 layers with running Vicryl sutures. The skin was closed with a 4-0 Monocryl subcuticular stitch. Steri-Strips and sterile dressing were applied.  She tolerated the procedure well without any apparent complications and was taken back to the holding room in satisfactory condition.  Estimated Blood Loss:  Minimal         Drains: none         Specimens: 6.5 cm subfascial soft tissue mass        Complications:  * No complications entered in OR log *         Disposition: PACU - hemodynamically stable.         Condition: stable

## 2012-09-26 ENCOUNTER — Encounter (HOSPITAL_BASED_OUTPATIENT_CLINIC_OR_DEPARTMENT_OTHER): Payer: Self-pay | Admitting: General Surgery

## 2012-10-03 ENCOUNTER — Ambulatory Visit
Admission: RE | Admit: 2012-10-03 | Discharge: 2012-10-03 | Disposition: A | Discharge status: Home / Self Care | Payer: BC Managed Care – PPO | Source: Ambulatory Visit | Attending: Family Medicine | Admitting: Family Medicine

## 2012-10-03 DIAGNOSIS — N644 Mastodynia: Secondary | ICD-10-CM

## 2012-10-03 IMAGING — MG MM DIGITAL DIAGNOSTIC BILAT
7 series · 7 of 7 positions shown · non-contrast
Comparison: None.

CLINICAL DATA: Diffuse left breast pain

DIGITAL DIAGNOSTIC BILATERAL MAMMOGRAM WITH CAD

[R CC]
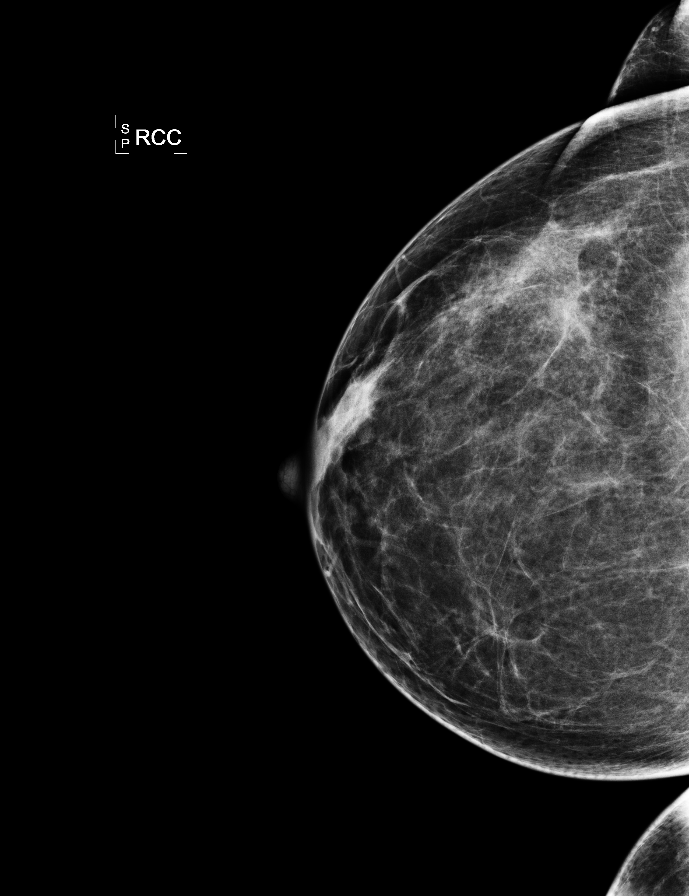

[L CC (1 of 2)]
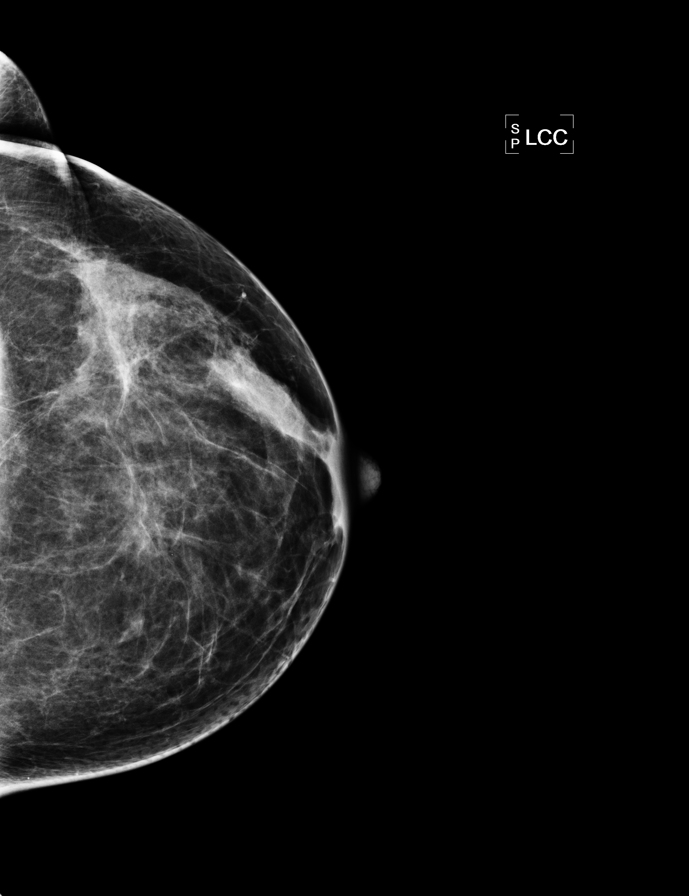

[L MLO]
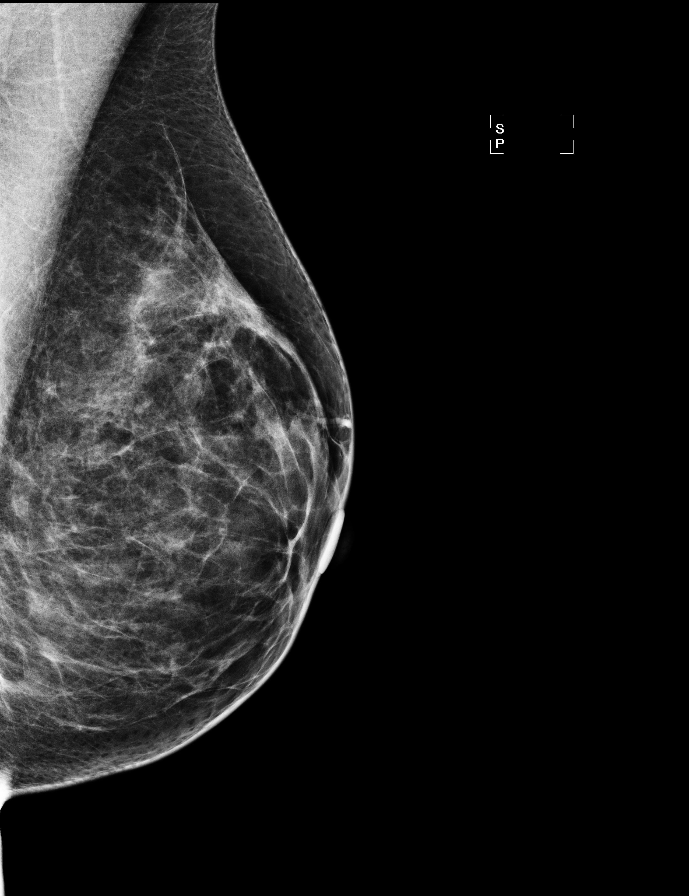

[R MLO]
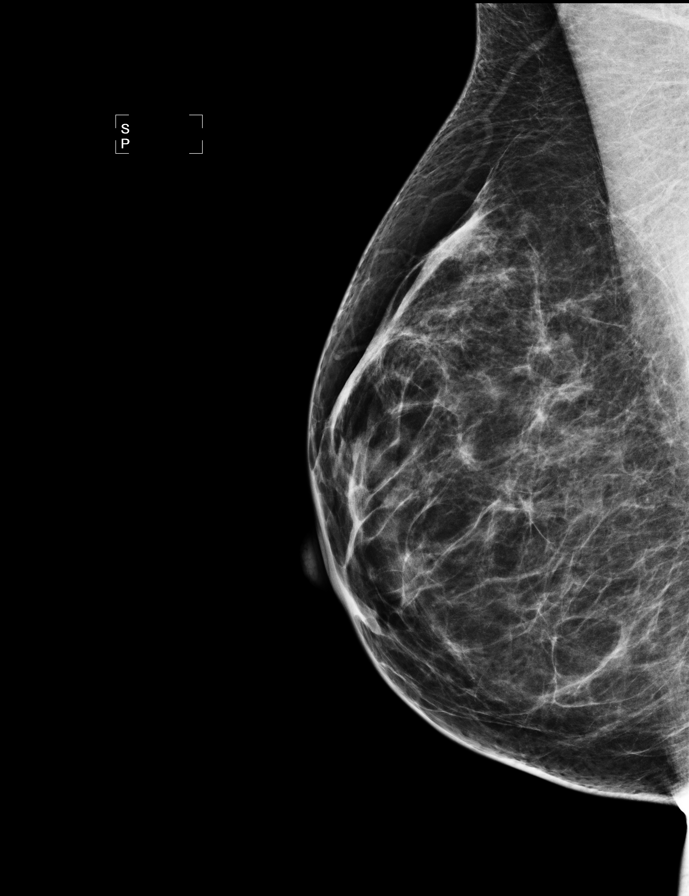

[L CC (2 of 2)]
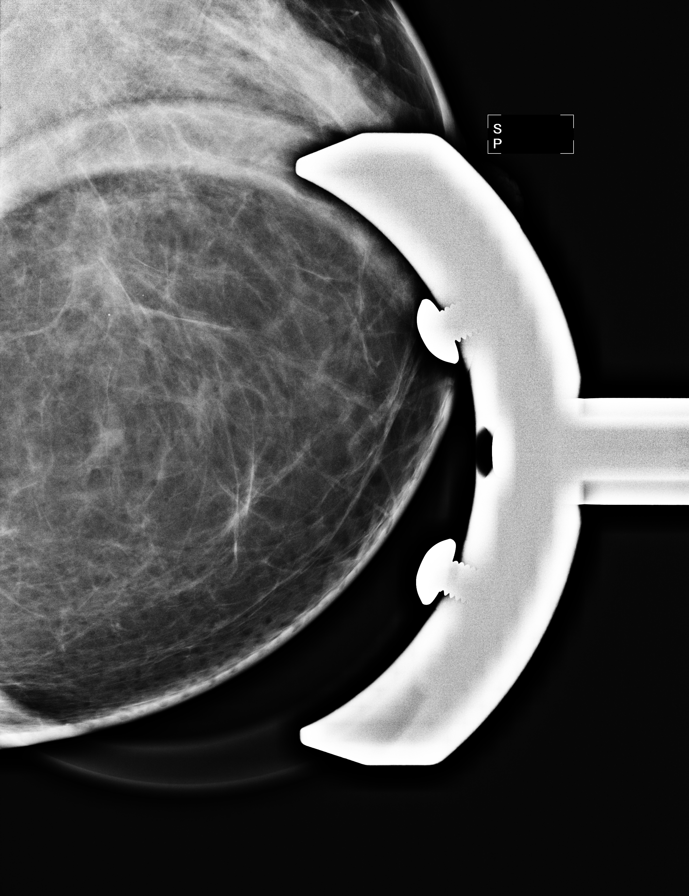

[L ML (1 of 2)]
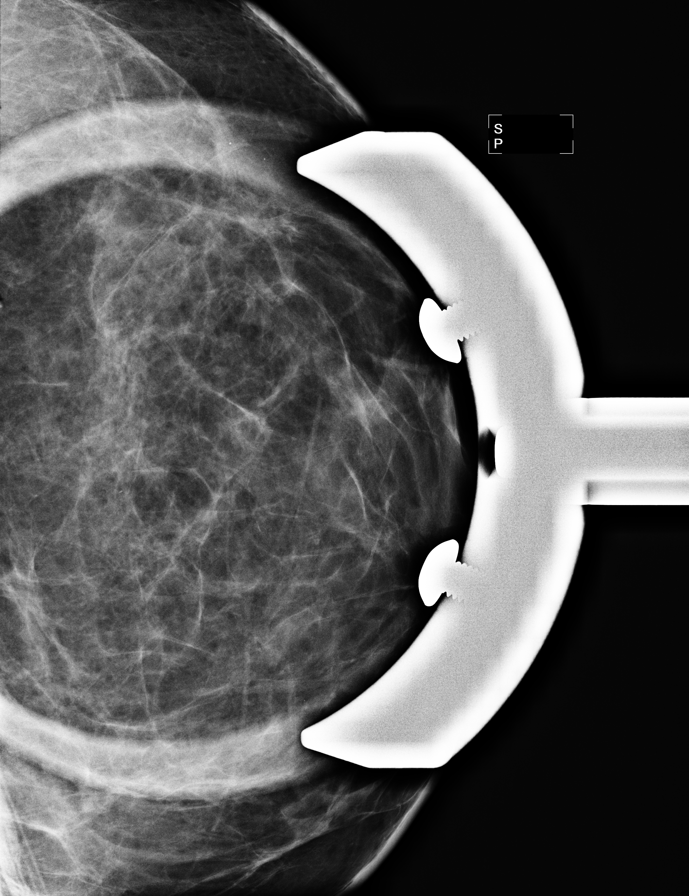

[L ML (2 of 2)]
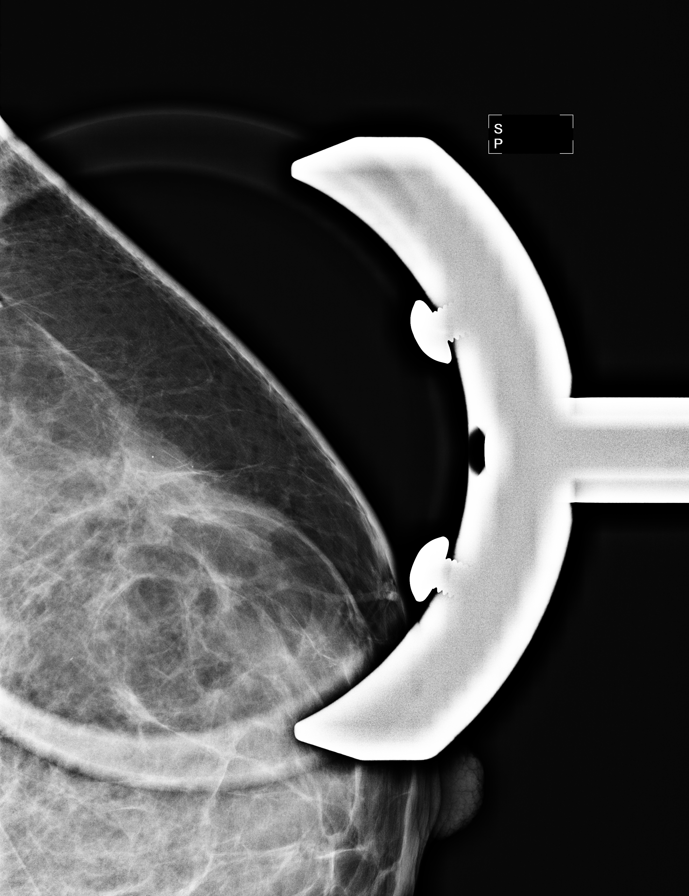

[7 of 7 positions shown; findings below may reference images not displayed]

FINDINGS: ACR Breast Density Category 2: There is a scattered fibroglandular
pattern.

Right breast is negative.  There is no mass or architectural
distortion on the left.  Magnification views on the left were
performed and indicate in the upper outer quadrant three round
scattered calcifications, characteristically benign in appearance.

Mammographic images were processed with CAD.
IMPRESSION: No suspicious findings.

BI-RADS CATEGORY 2:  Benign finding(s).

RECOMMENDATION:
Screening mammogram age 41.  The causes of and treatments for
breast pain were discussed with the patient.  She had no further
questions.  The patient was encouraged to continue monthly self-
examination and to report any potential changes to her physician.

I have discussed the findings and recommendations with the patient.
Results were also provided in writing at the conclusion of the
visit.

## 2012-10-20 ENCOUNTER — Encounter (INDEPENDENT_AMBULATORY_CARE_PROVIDER_SITE_OTHER): Payer: Self-pay | Admitting: General Surgery

## 2012-10-20 ENCOUNTER — Ambulatory Visit (INDEPENDENT_AMBULATORY_CARE_PROVIDER_SITE_OTHER): Payer: BC Managed Care – PPO | Admitting: General Surgery

## 2012-10-20 VITALS — BP 108/62 | HR 64 | Temp 97.3°F | Resp 12 | Ht 66.0 in | Wt 141.0 lb

## 2012-10-20 DIAGNOSIS — D1779 Benign lipomatous neoplasm of other sites: Secondary | ICD-10-CM

## 2012-10-20 DIAGNOSIS — D171 Benign lipomatous neoplasm of skin and subcutaneous tissue of trunk: Secondary | ICD-10-CM | POA: Insufficient documentation

## 2012-10-20 NOTE — Patient Instructions (Signed)
Let the Steri-Strips fall off by themselves. 

## 2012-10-20 NOTE — Progress Notes (Signed)
Procedure:  Removal of soft tissue mass from left upper back  Date:  09/25/2012  Pathology:  Benign lipoma  History:  She is here for her first postoperative visit and is doing well  Exam: General- Is in NAD. Back-left upper back wound is clean and intact; Steri-Strips are present.  Assessment:  Pathology is benign and wound is healing well.  Plan:  Return visit as needed.

## 2013-02-16 ENCOUNTER — Encounter (INDEPENDENT_AMBULATORY_CARE_PROVIDER_SITE_OTHER): Payer: Self-pay | Admitting: General Surgery

## 2013-02-16 ENCOUNTER — Ambulatory Visit (INDEPENDENT_AMBULATORY_CARE_PROVIDER_SITE_OTHER): Payer: BC Managed Care – PPO | Admitting: General Surgery

## 2013-02-16 VITALS — BP 110/76 | HR 68 | Temp 97.2°F | Resp 18 | Ht 66.0 in | Wt 142.4 lb

## 2013-02-16 DIAGNOSIS — L905 Scar conditions and fibrosis of skin: Secondary | ICD-10-CM

## 2013-02-16 NOTE — Patient Instructions (Signed)
Apply Mederma to the scar 3 times a day for 8 weeks.  Avoid direct sun exposure to the area for 3 months.  Avoid rubbing the area or trauma to the area.

## 2013-02-16 NOTE — Progress Notes (Signed)
Subjective:     Patient ID: Tricia Davis, female   DOB: [birth year], 37 y.o.   MRN: [id number]  HPI She comes in concerned about the scar in the upper back and possibly feeling a lump in that area. She states her son crawls on the area and tends to press on it does not necessarily pick at it. No bleeding or drainage from the area.   Review of Delight Stare is otherwise doing well.     Objective:   Physical Exam Gen.-she is in no acute distress.  Back-left back scar appears mildly irritated but there is no significant erythema or induration present.    Assessment:     Some mild scar irritation but no evidence of infection.     Plan:     Start applying Mederma to scar.  Avoid trauma to the scar.  Keep scar out of direct sunlight for 3 months.  Return prn.

## 2014-07-12 ENCOUNTER — Other Ambulatory Visit: Payer: Self-pay | Admitting: Obstetrics and Gynecology

## 2014-07-12 DIAGNOSIS — N644 Mastodynia: Secondary | ICD-10-CM

## 2014-07-19 ENCOUNTER — Encounter (INDEPENDENT_AMBULATORY_CARE_PROVIDER_SITE_OTHER): Payer: Self-pay | Admitting: General Surgery

## 2014-07-21 ENCOUNTER — Other Ambulatory Visit: Payer: BC Managed Care – PPO

## 2014-07-27 ENCOUNTER — Ambulatory Visit
Admission: RE | Admit: 2014-07-27 | Discharge: 2014-07-27 | Disposition: A | Discharge status: Home / Self Care | Payer: BC Managed Care – PPO | Source: Ambulatory Visit | Attending: Obstetrics and Gynecology | Admitting: Obstetrics and Gynecology

## 2014-07-27 ENCOUNTER — Other Ambulatory Visit: Payer: Self-pay | Admitting: Obstetrics and Gynecology

## 2014-07-27 DIAGNOSIS — N644 Mastodynia: Secondary | ICD-10-CM

## 2014-07-27 IMAGING — MG MM DIGITAL DIAGNOSTIC BILAT
4 series · 4 of 4 positions shown · non-contrast
Comparison: [DATE]

CLINICAL DATA: Diffuse left breast pain

EXAM:
DIGITAL DIAGNOSTIC  BILATERAL MAMMOGRAM WITH CAD

[R MLO]
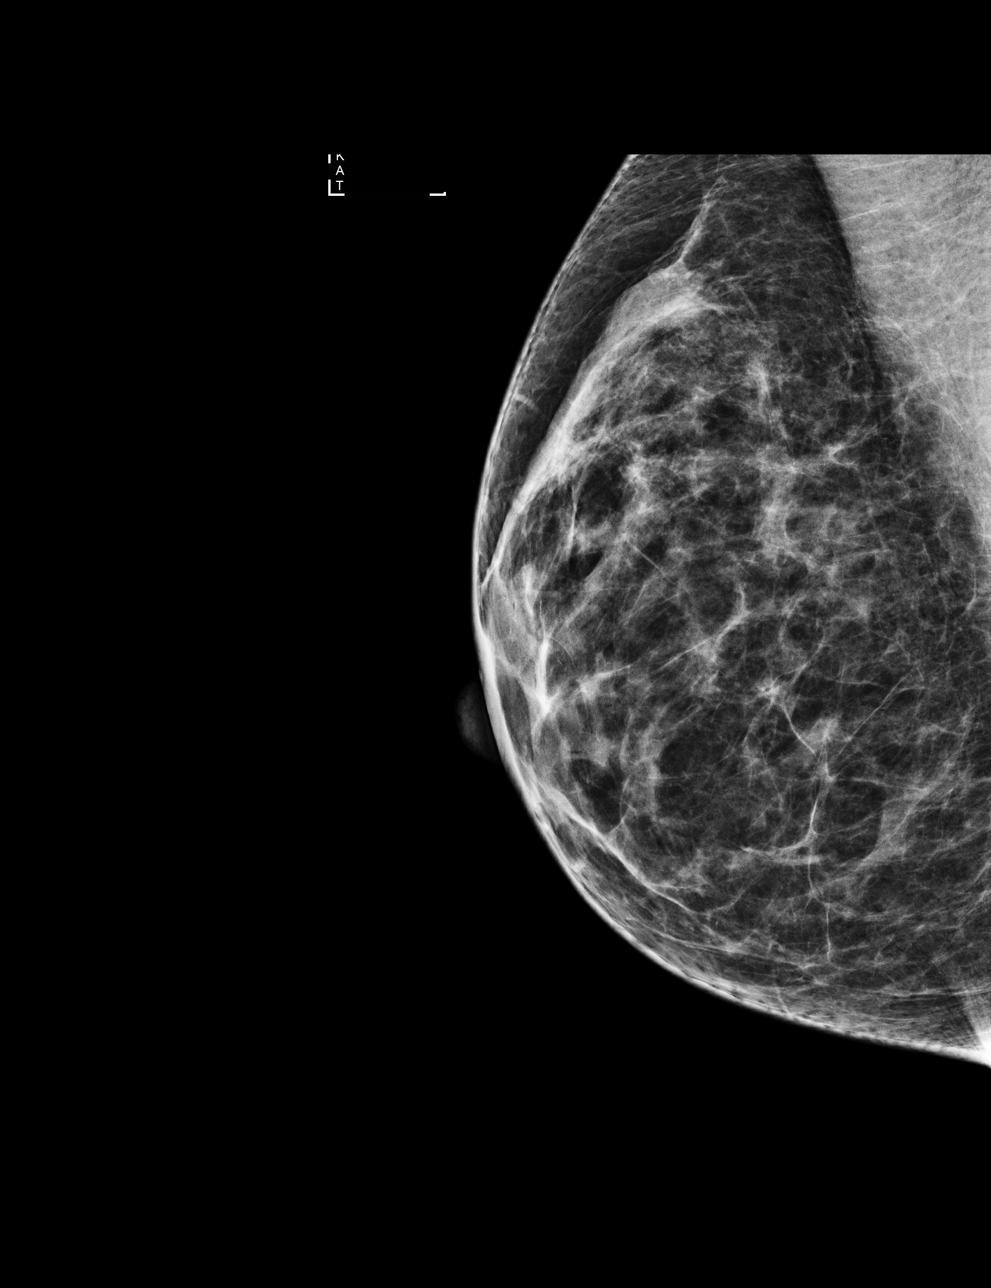

[L MLO]
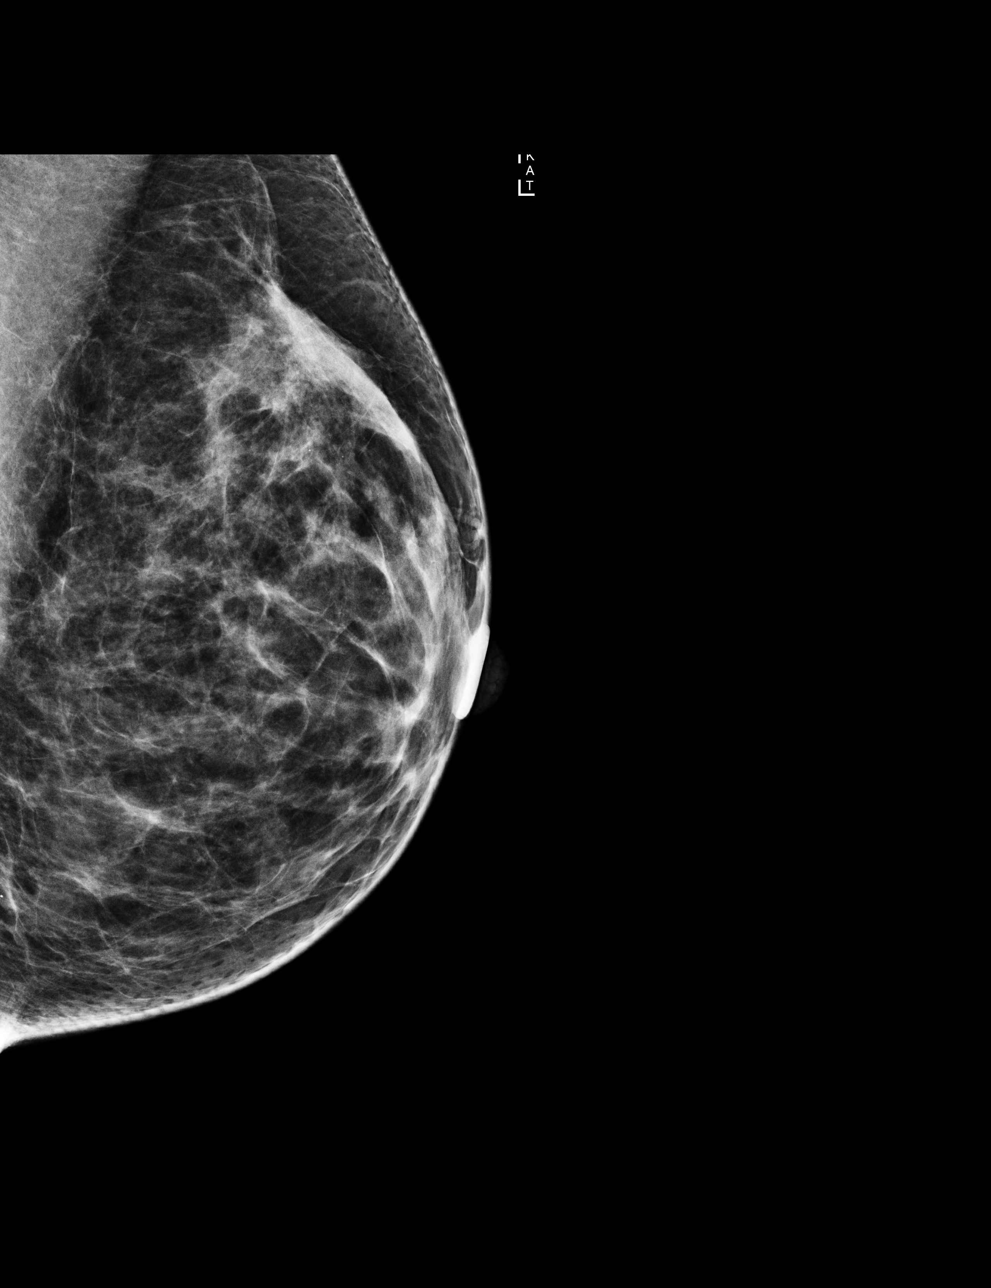

[R CC]
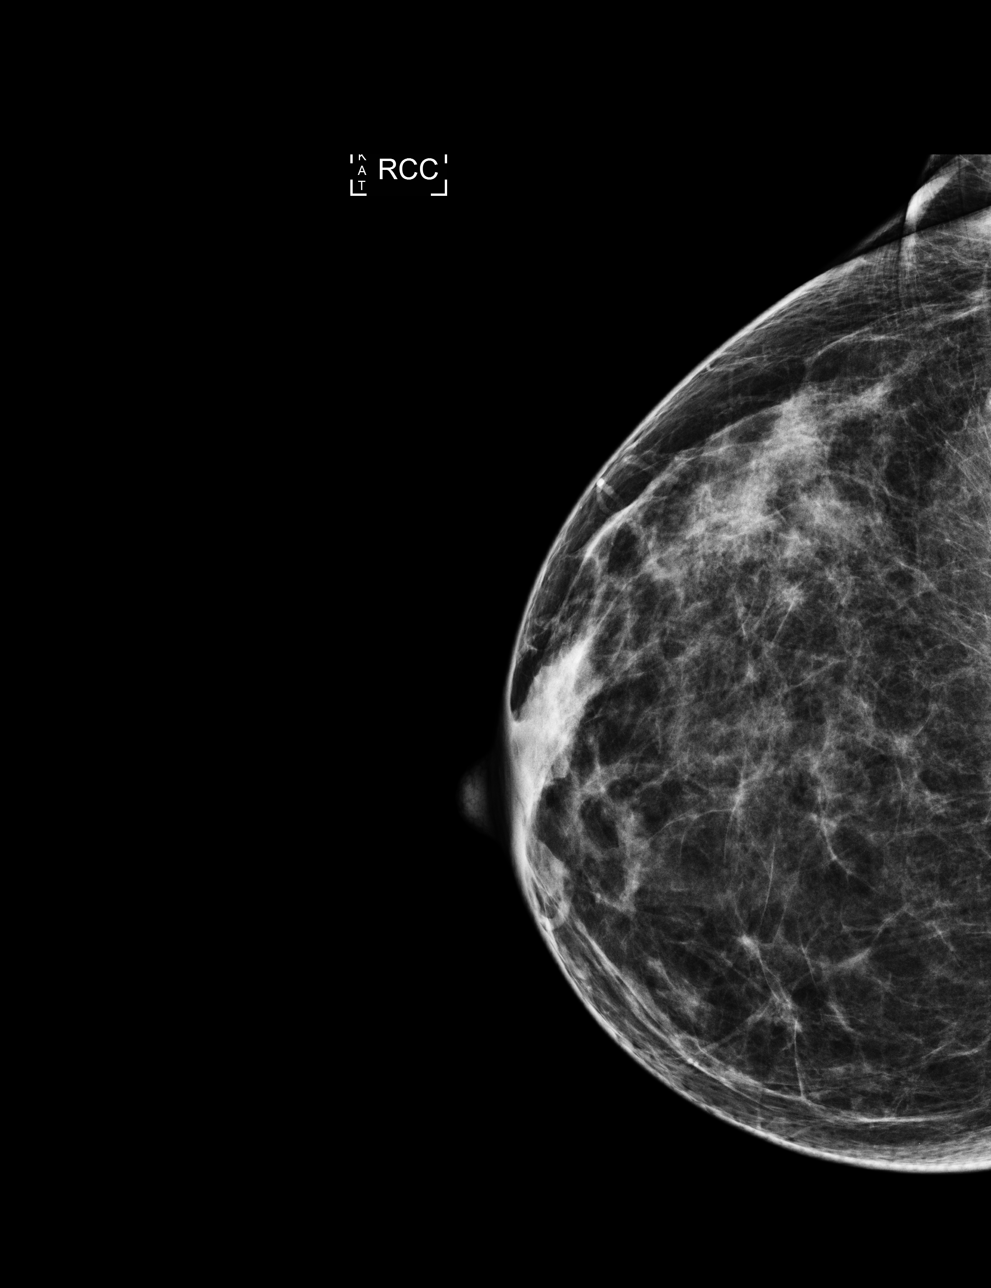

[L CC]
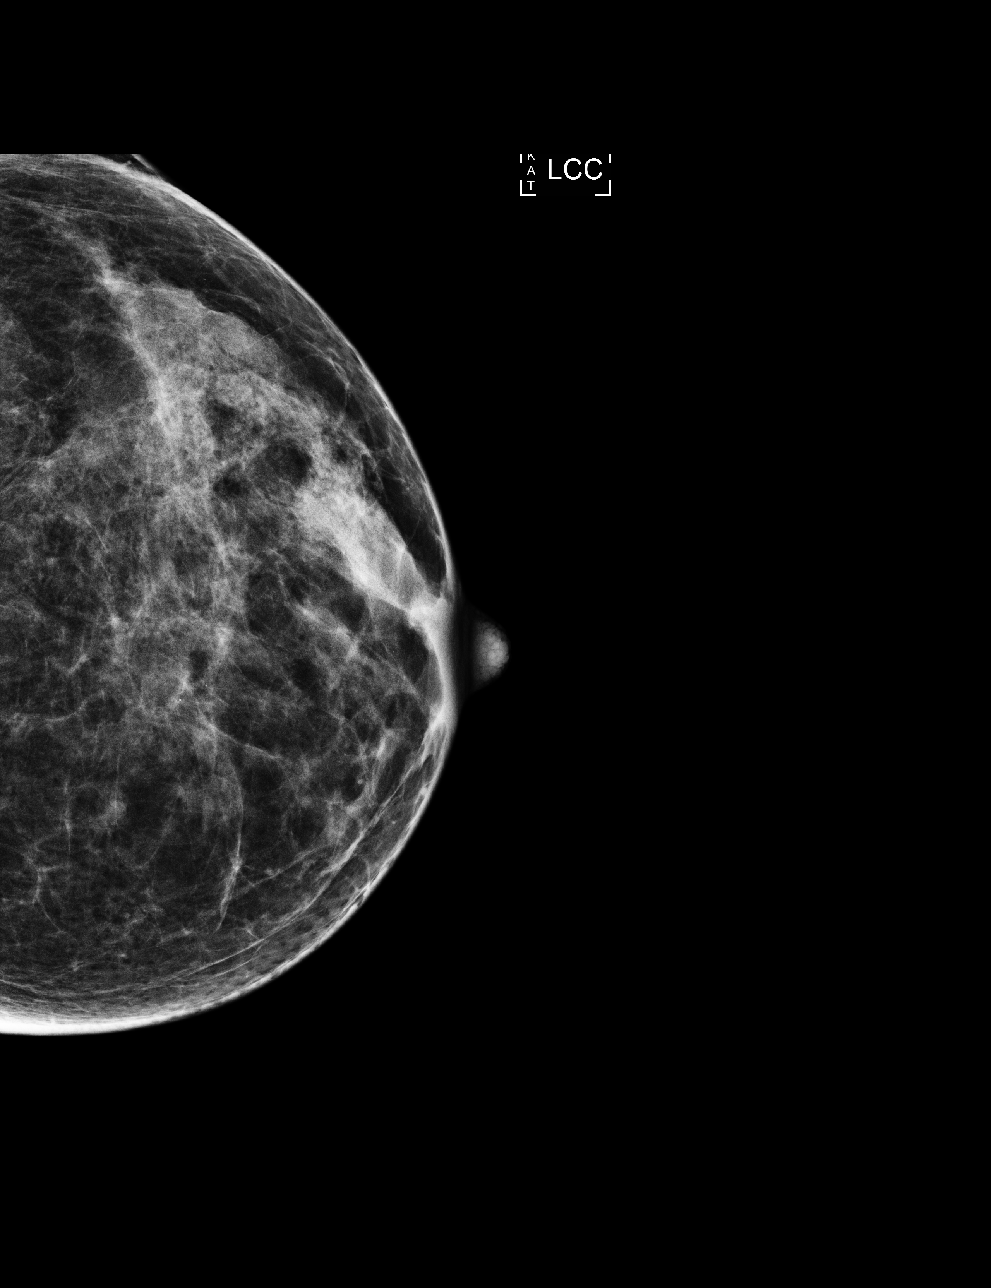

[4 of 4 positions shown; findings below may reference images not displayed]

ACR Breast Density Category b: There are scattered areas of
fibroglandular density.
FINDINGS: Cc and MLO views of bilateral breasts are submitted. No suspicious
abnormalities identified bilaterally.

Mammographic images were processed with CAD.
IMPRESSION: Negative.

RECOMMENDATION:
Routine screening mammogram at age 41. Management on clinical basis
for left breast pain.

I have discussed the findings and recommendations with the patient.
Results were also provided in writing at the conclusion of the
visit. If applicable, a reminder letter will be sent to the patient
regarding the next appointment.

BI-RADS CATEGORY  1: Negative

## 2015-08-24 ENCOUNTER — Ambulatory Visit
Admission: RE | Admit: 2015-08-24 | Discharge: 2015-08-24 | Disposition: A | Discharge status: Home / Self Care | Payer: BLUE CROSS/BLUE SHIELD | Source: Ambulatory Visit | Attending: Family Medicine | Admitting: Family Medicine

## 2015-08-24 ENCOUNTER — Other Ambulatory Visit: Payer: Self-pay | Admitting: Family Medicine

## 2015-08-24 DIAGNOSIS — M25532 Pain in left wrist: Secondary | ICD-10-CM

## 2015-08-24 IMAGING — CR DG WRIST COMPLETE 3+V*L*
4 series · 4 of 4 positions shown · non-contrast
Comparison: None.

CLINICAL DATA: Left wrist pain after injury [DATE].

These results will be called to the ordering clinician or
representative by the [HOSPITAL] at the imaging location.
EXAM:
LEFT WRIST - COMPLETE 3+ VIEW

[view not recorded (1 of 4)]
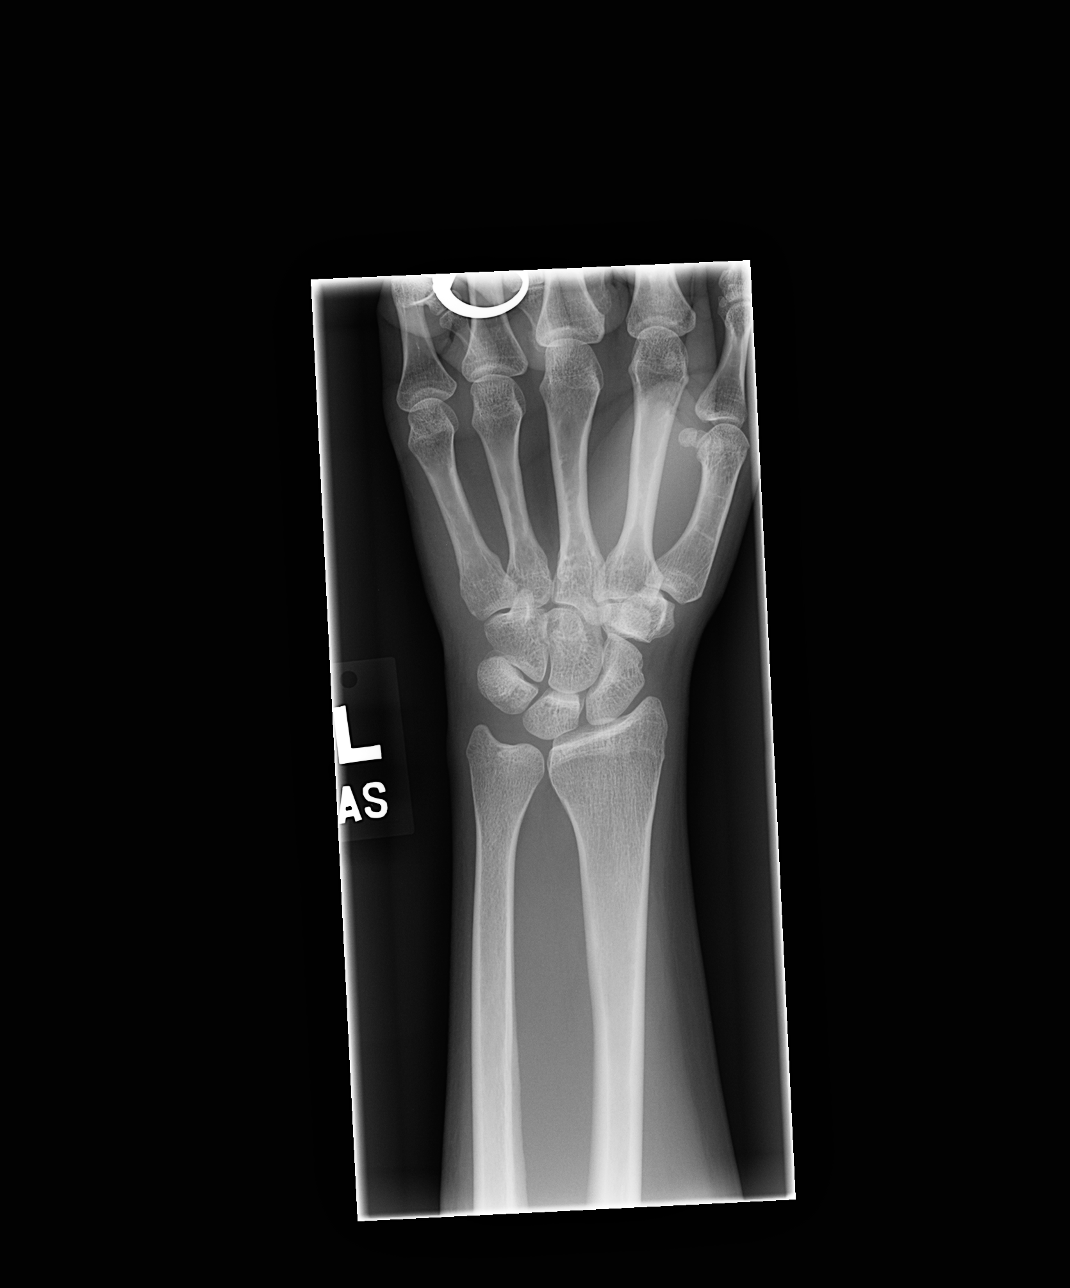

[view not recorded (2 of 4)]
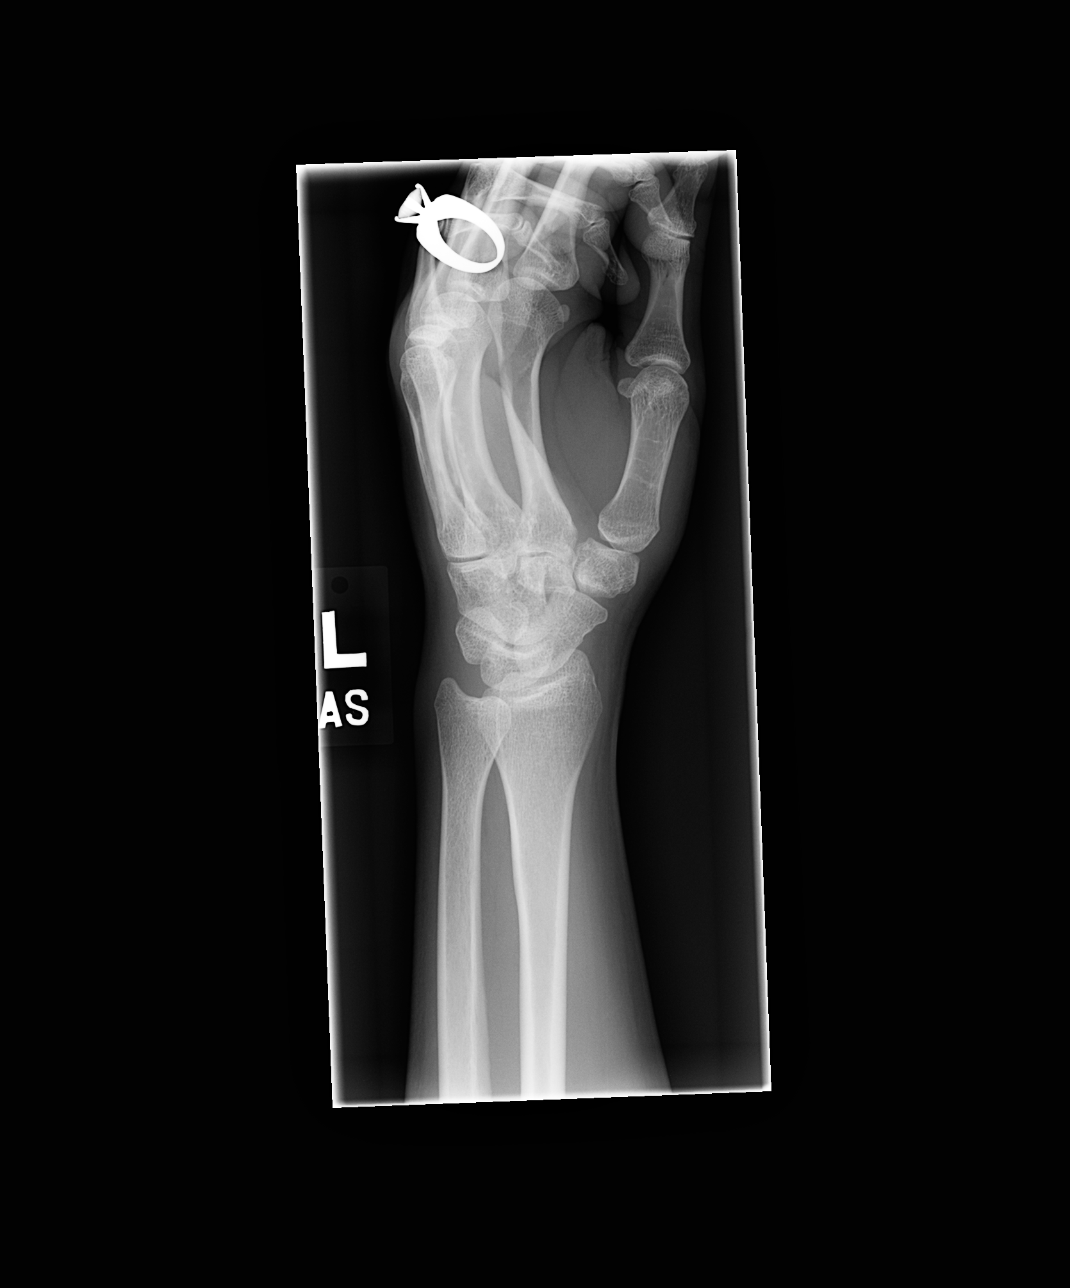

[view not recorded (3 of 4)]
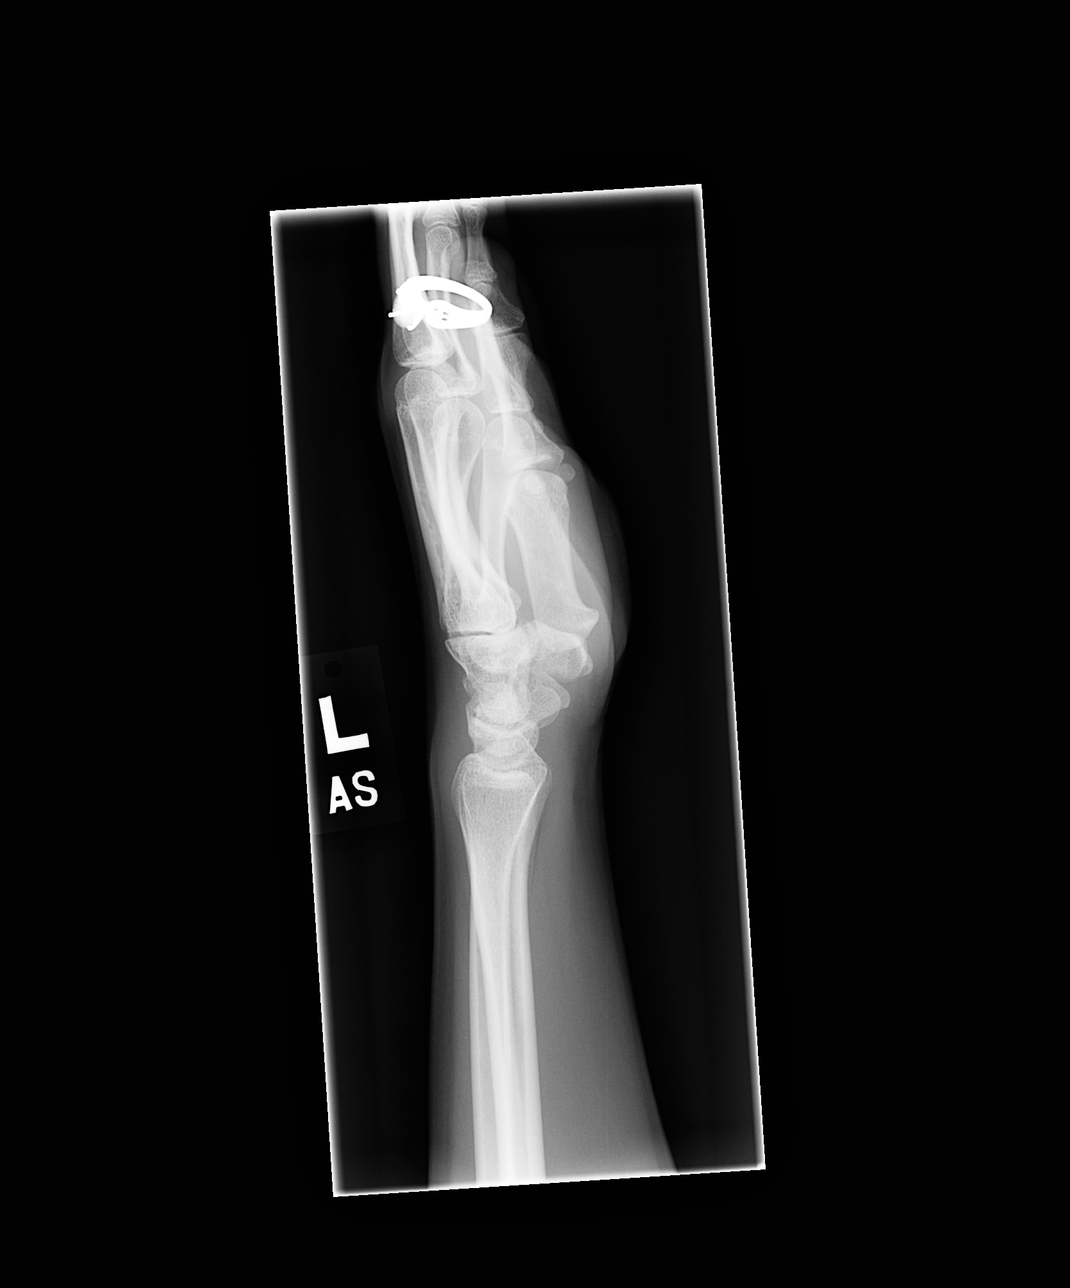

[view not recorded (4 of 4)]
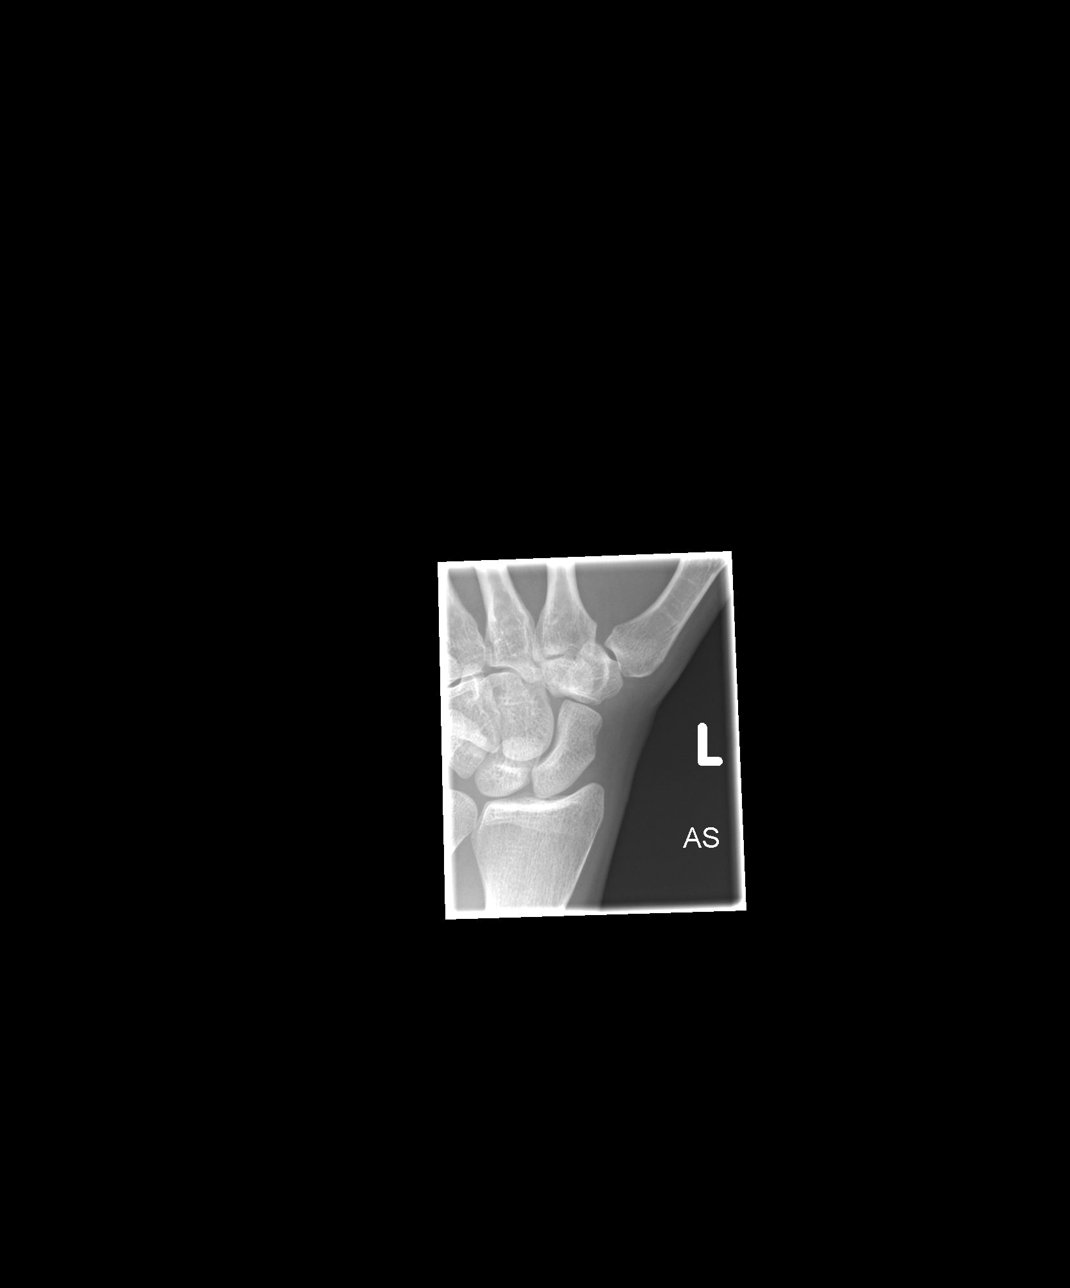

[4 of 4 positions shown; findings below may reference images not displayed]

FINDINGS: There is no evidence of fracture or dislocation. There is no
evidence of arthropathy or other focal bone abnormality. Soft
tissues are unremarkable.
IMPRESSION: Negative.

## 2015-11-11 ENCOUNTER — Other Ambulatory Visit: Payer: Self-pay | Admitting: Surgery

## 2017-03-21 ENCOUNTER — Other Ambulatory Visit: Payer: Self-pay | Admitting: Obstetrics and Gynecology

## 2017-03-21 DIAGNOSIS — Z1231 Encounter for screening mammogram for malignant neoplasm of breast: Secondary | ICD-10-CM

## 2017-04-17 ENCOUNTER — Encounter: Payer: Self-pay | Admitting: Radiology

## 2017-04-17 ENCOUNTER — Ambulatory Visit
Admission: RE | Admit: 2017-04-17 | Discharge: 2017-04-17 | Disposition: A | Discharge status: Home / Self Care | Source: Ambulatory Visit | Attending: Obstetrics and Gynecology | Admitting: Obstetrics and Gynecology

## 2017-04-17 DIAGNOSIS — Z1231 Encounter for screening mammogram for malignant neoplasm of breast: Secondary | ICD-10-CM

## 2017-04-17 IMAGING — MG DIGITAL SCREENING BILATERAL MAMMOGRAM WITH CAD
2 series · 2 of 2 positions shown · non-contrast
Comparison: Previous exam(s).

CLINICAL DATA: Screening.

EXAM:
DIGITAL SCREENING BILATERAL MAMMOGRAM WITH CAD

[R CC]
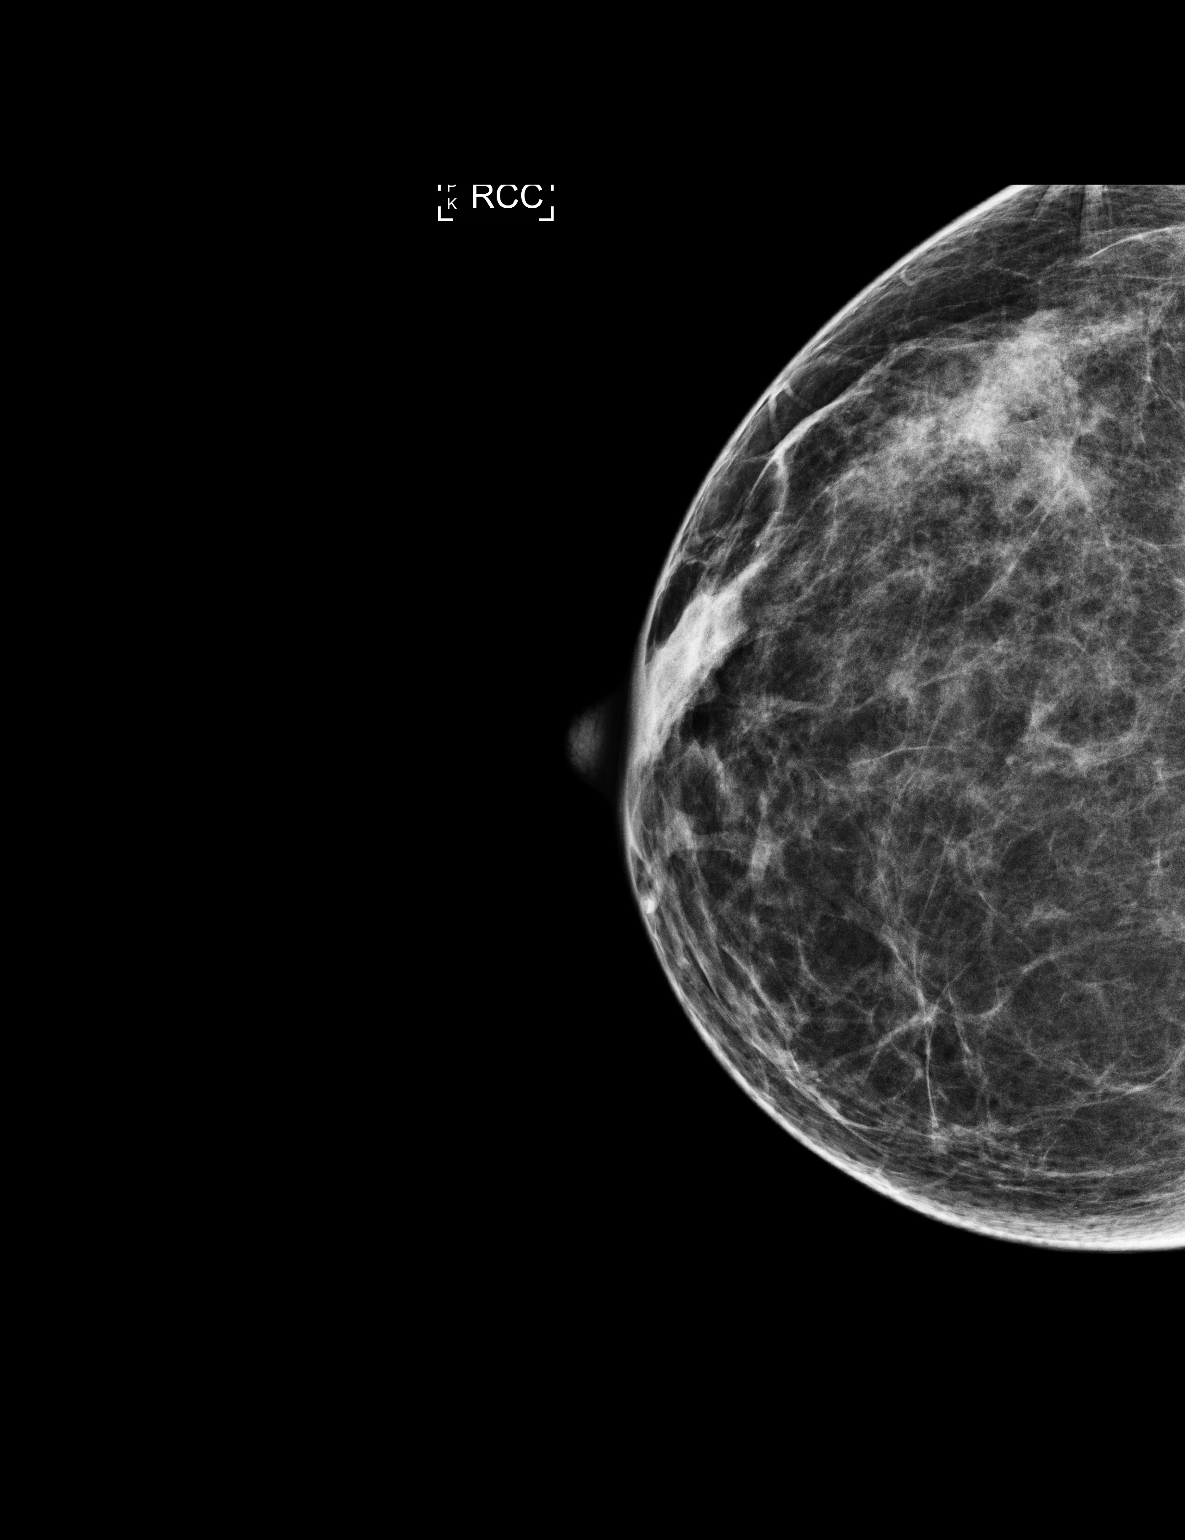

[L CC]
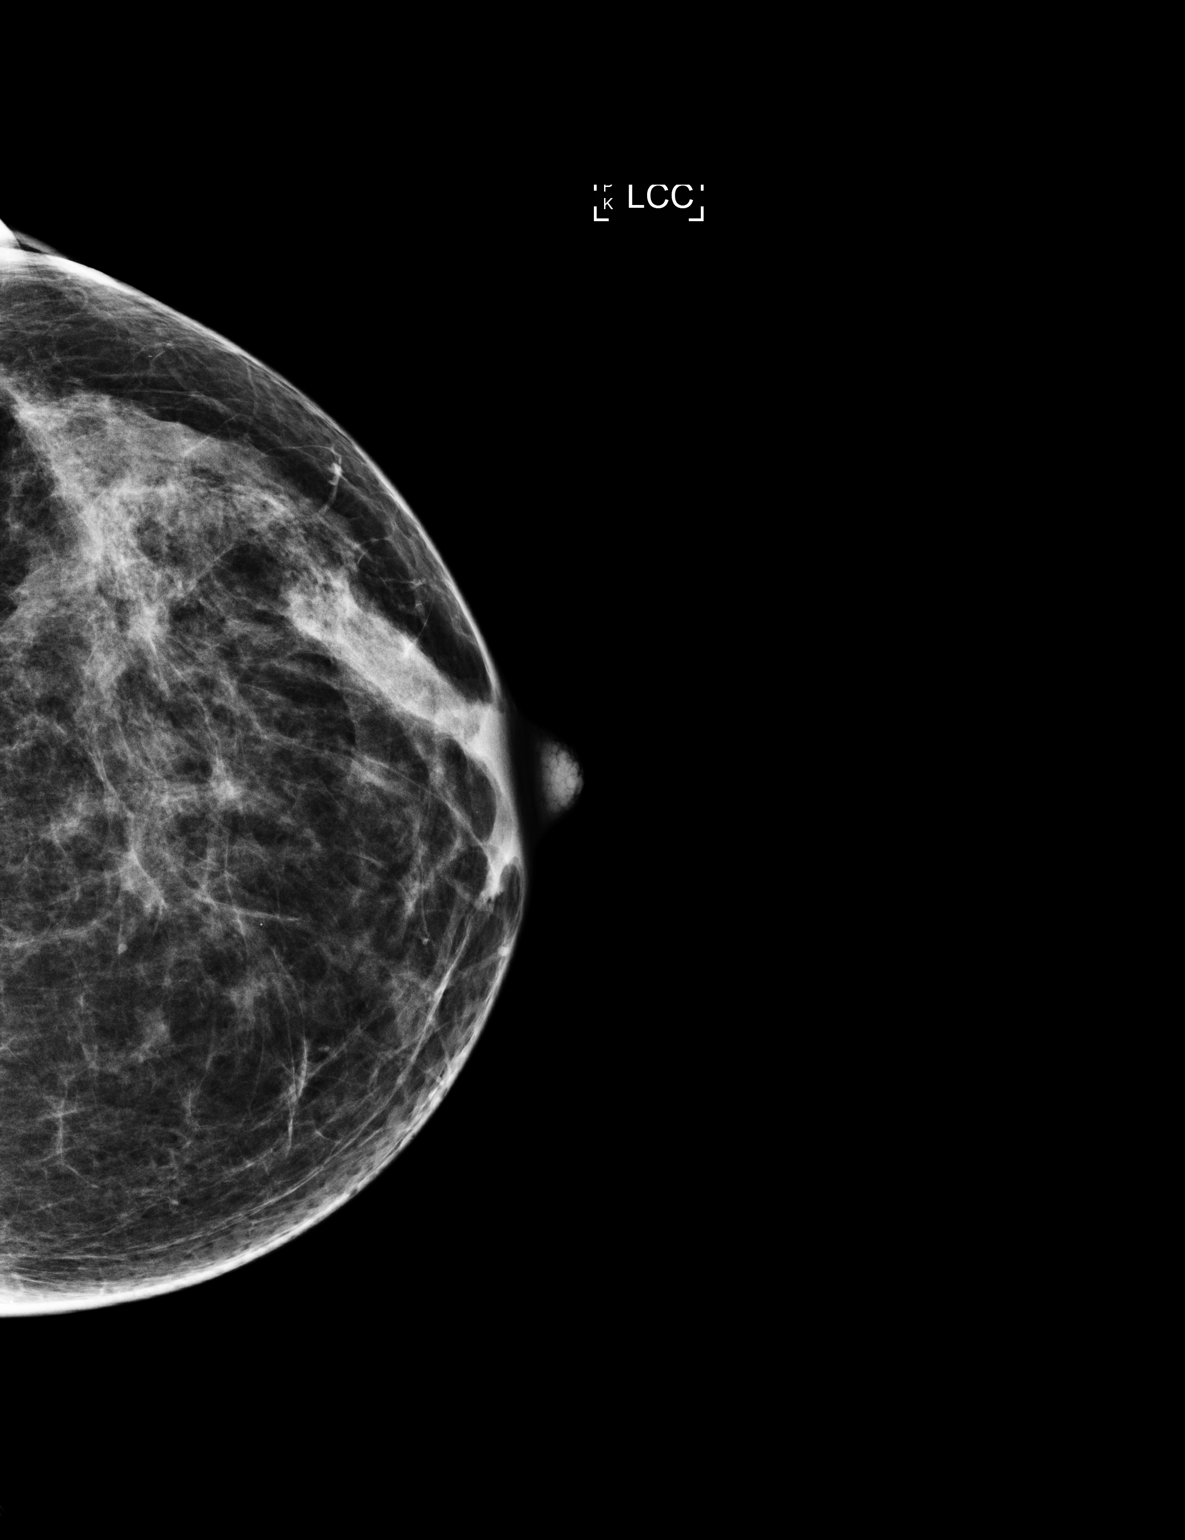

[2 of 2 positions shown; findings below may reference images not displayed]

ACR Breast Density Category b: There are scattered areas of
fibroglandular density.
FINDINGS: In the left breast, possible asymmetries warrant further evaluation.
These possible asymmetries are seen within the upper left breast, at
anterior and middle depth, seen on the MLO projection only.

In the right breast, no findings suspicious for malignancy. Images
were processed with CAD.
IMPRESSION: Further evaluation is suggested for possible asymmetries in the
upper left breast.

RECOMMENDATION:
Diagnostic mammogram and possibly ultrasound of the left breast.
(Code:[C2])

The patient will be contacted regarding the findings, and additional
imaging will be scheduled.

BI-RADS CATEGORY  0: Incomplete. Need additional imaging evaluation
and/or prior mammograms for comparison.

## 2017-04-18 ENCOUNTER — Other Ambulatory Visit: Payer: Self-pay | Admitting: Obstetrics and Gynecology

## 2017-04-18 DIAGNOSIS — R928 Other abnormal and inconclusive findings on diagnostic imaging of breast: Secondary | ICD-10-CM

## 2017-04-23 ENCOUNTER — Ambulatory Visit

## 2017-04-23 ENCOUNTER — Ambulatory Visit
Admission: RE | Admit: 2017-04-23 | Discharge: 2017-04-23 | Disposition: A | Discharge status: Home / Self Care | Source: Ambulatory Visit | Attending: Obstetrics and Gynecology | Admitting: Obstetrics and Gynecology

## 2017-04-23 DIAGNOSIS — R928 Other abnormal and inconclusive findings on diagnostic imaging of breast: Secondary | ICD-10-CM

## 2017-05-26 ENCOUNTER — Ambulatory Visit (INDEPENDENT_AMBULATORY_CARE_PROVIDER_SITE_OTHER)

## 2017-05-26 ENCOUNTER — Encounter (HOSPITAL_COMMUNITY): Payer: Self-pay | Admitting: *Deleted

## 2017-05-26 ENCOUNTER — Ambulatory Visit (HOSPITAL_COMMUNITY)
Admission: EM | Admit: 2017-05-26 | Discharge: 2017-05-26 | Disposition: A | Discharge status: Home / Self Care | Attending: Emergency Medicine | Admitting: Emergency Medicine

## 2017-05-26 DIAGNOSIS — R110 Nausea: Secondary | ICD-10-CM

## 2017-05-26 DIAGNOSIS — N898 Other specified noninflammatory disorders of vagina: Secondary | ICD-10-CM

## 2017-05-26 DIAGNOSIS — R109 Unspecified abdominal pain: Secondary | ICD-10-CM | POA: Diagnosis not present

## 2017-05-26 HISTORY — DX: Other specified bacterial agents as the cause of diseases classified elsewhere: B96.89

## 2017-05-26 HISTORY — DX: Acute vaginitis: N76.0

## 2017-05-26 LAB — POCT URINALYSIS DIP (DEVICE)
BILIRUBIN URINE: NEGATIVE
Glucose, UA: NEGATIVE mg/dL
Hgb urine dipstick: NEGATIVE
KETONES UR: NEGATIVE mg/dL
Leukocytes, UA: NEGATIVE
NITRITE: NEGATIVE
Protein, ur: NEGATIVE mg/dL
Specific Gravity, Urine: 1.02 (ref 1.005–1.030)
Urobilinogen, UA: 0.2 mg/dL (ref 0.0–1.0)
pH: 5.5 (ref 5.0–8.0)

## 2017-05-26 IMAGING — DX DG ABDOMEN 2V
2 series · 2 of 2 positions shown · non-contrast
Comparison: No priors.

CLINICAL DATA: 41-year-old female with lower back and abdominal
pain for the past 2 days, most severe on the left side.

EXAM:
ABDOMEN - 2 VIEW

[abdomen erect]
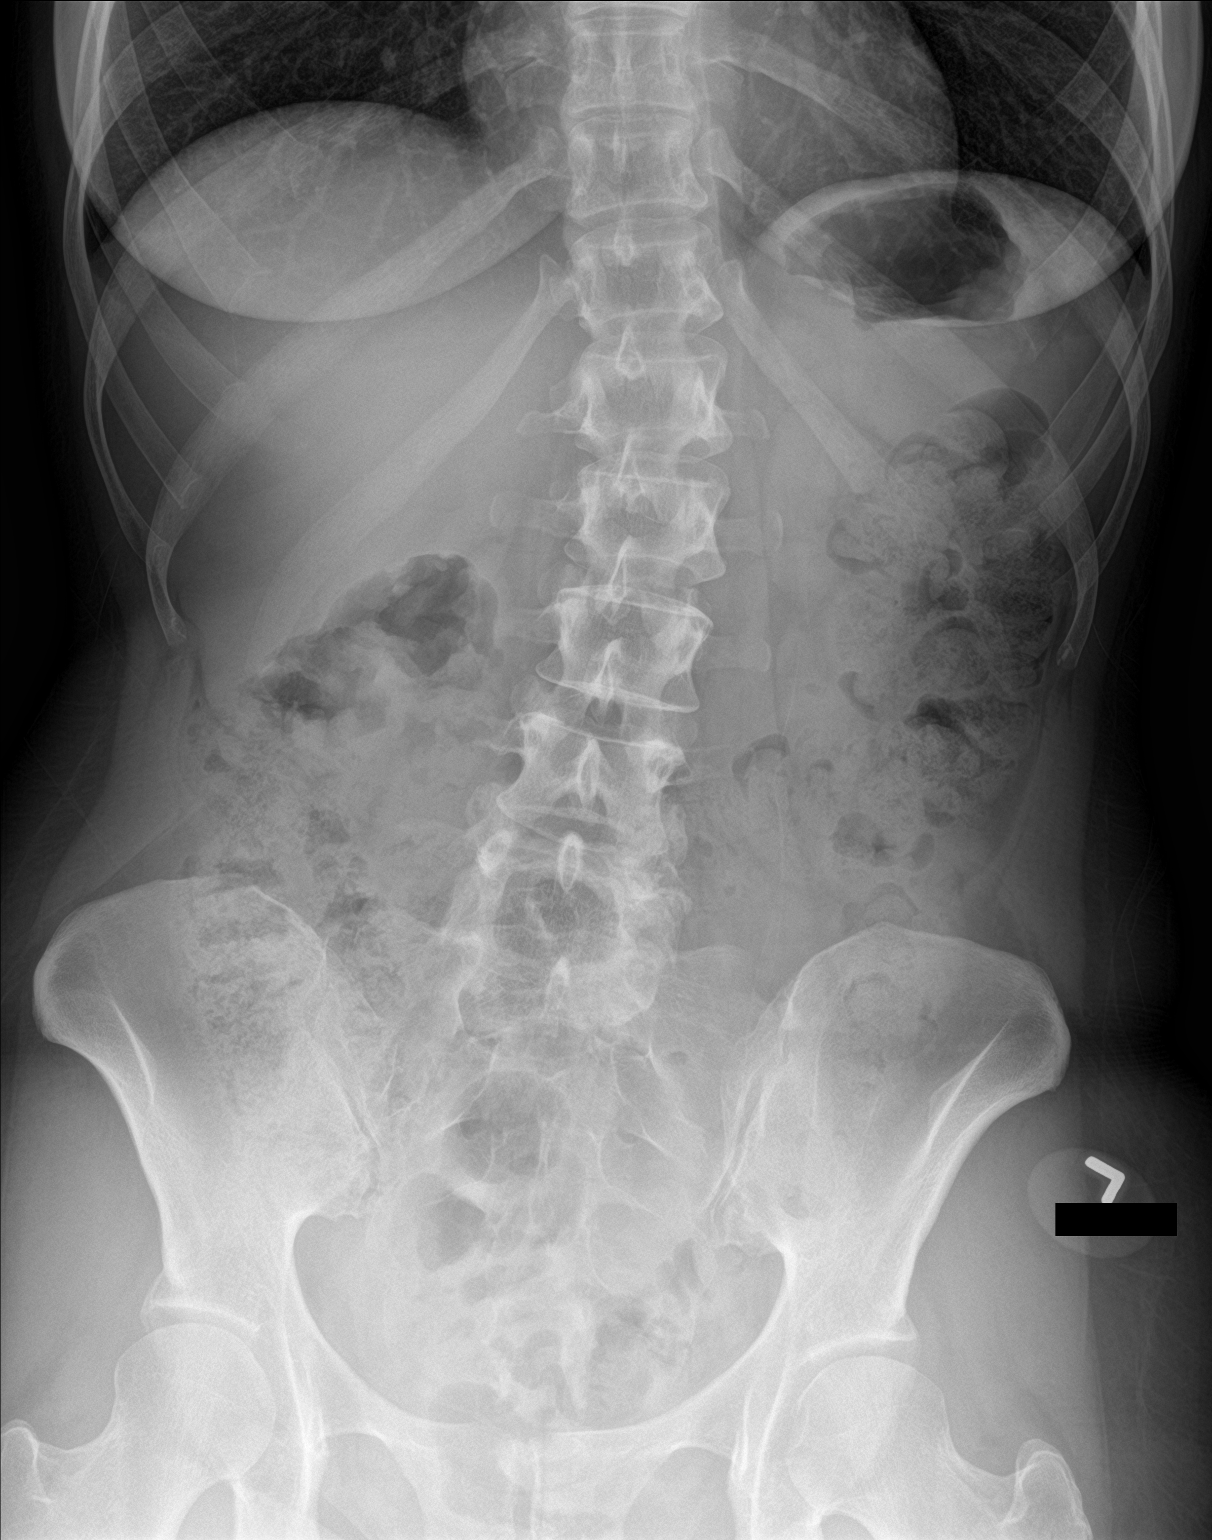

[abdomen supine]
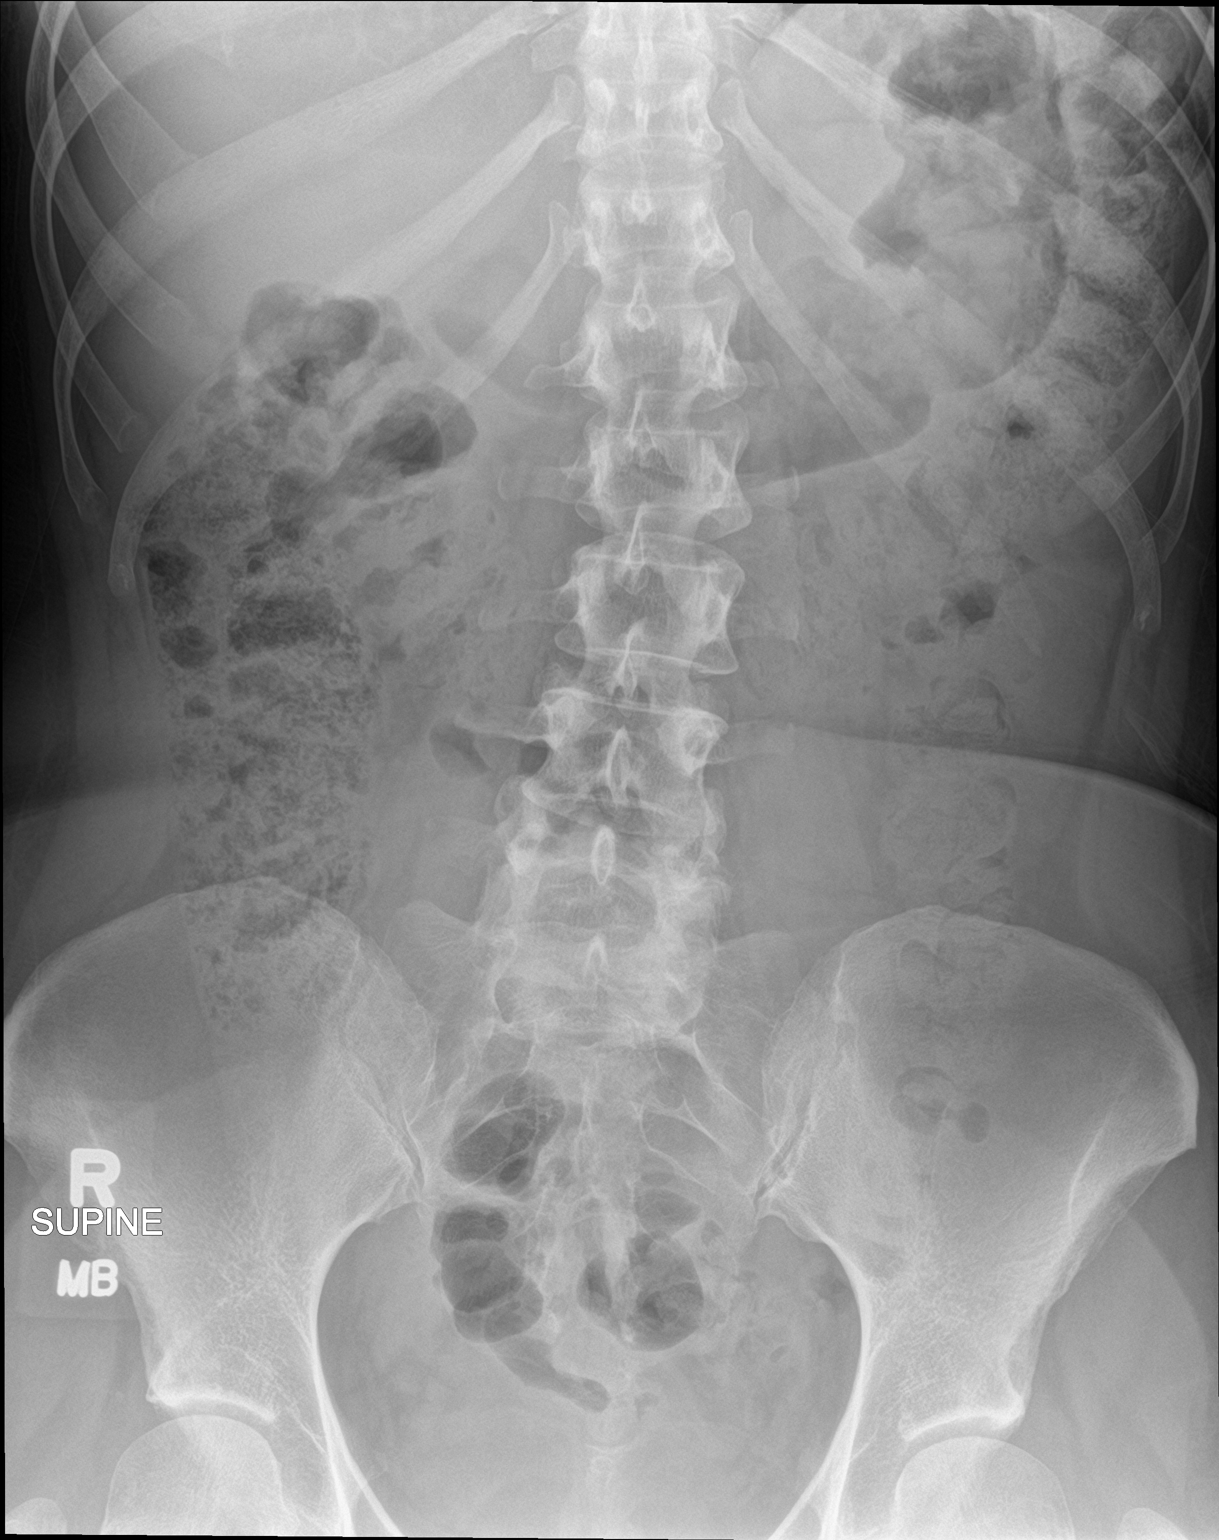

[2 of 2 positions shown; findings below may reference images not displayed]

FINDINGS: Gas and stool are seen scattered throughout the colon extending to
the level of the distal rectum. No pathologic distension of small
bowel is noted. Moderate volume of stool throughout the colon. No
definite urinary tract calculi identified. No gross evidence of
pneumoperitoneum.
IMPRESSION: 1.  Nonobstructive bowel gas pattern.
2. No pneumoperitoneum.
3. Moderate volume of stool may suggest constipation.

## 2017-05-26 MED ORDER — HYDROCODONE-ACETAMINOPHEN 5-325 MG PO TABS: 1.0000 | ORAL_TABLET | ORAL | 0 refills | Status: DC | PRN

## 2017-05-26 MED ORDER — KETOROLAC TROMETHAMINE 60 MG/2ML IM SOLN
60.0000 mg | Freq: Once | INTRAMUSCULAR | Status: AC
  Administered 2017-05-26: 60 mg via INTRAMUSCULAR

## 2017-05-26 MED ORDER — KETOROLAC TROMETHAMINE 60 MG/2ML IM SOLN
INTRAMUSCULAR | Status: AC
  Filled 2017-05-26: qty 2

## 2017-05-26 NOTE — Discharge Instructions (Signed)
Follow up with your primary care provider to determine cause of flank pain.  If it returns and is severe, go to emergency room.

## 2017-05-26 NOTE — ED Provider Notes (Signed)
[place]    CSN: [id number] Arrival date & time: 05/26/17  1741    History   Chief Complaint Chief Complaint  Patient presents with  . Back Pain   HPI   Tricia Davis is a 41 y.o. female resenting with complaints of right middle back/flank pain. Denies any known injury or significant history of back trouble. Patient states she has a recent diagnosis of bacterial vaginosis with a yeast infection for which she was doing topical treatment and an oral Diflucan. Patient states that over the past several days and feeling some "twinges" in this area, and thought it was possible she was having an issue with the medication. Patient denies any diagnosis of UTI or symptoms of dysuria, hematuria or pattern of pain with urination. Denies history of kidney stones.  Past Medical History:  Diagnosis Date  . Bacterial vaginosis   . Lipoma of other specified sites   . Lumbago     Patient Active Problem List   Diagnosis Date Noted  . Scar of back-irritation 02/16/2013  . Blighted ovum 03/28/2012    Class: Present on Admission    Past Surgical History:  Procedure Laterality Date  . CESAREAN SECTION  02/09/2011   x 1  . DILATION AND CURETTAGE OF UTERUS     x 3 for MAB  . DILATION AND EVACUATION  03/28/2012   Procedure: DILATATION AND EVACUATION;  Surgeon: Darlyn Chamber, MD;  Location: [facility];  Service: Gynecology;  Laterality: N/A;  chromosome studies  . dnc  06/2010  . MASS EXCISION  09/25/2012   Procedure: MINOR EXCISION OF MASS;  Surgeon: Odis Hollingshead, MD;  Location: [facility];  Service: General;  Laterality: N/A;  remove soft tissue mass on upper back 5.5 cm  . WISDOM TOOTH EXTRACTION      OB History    Gravida Para Term Preterm AB Living   1             SAB TAB Ectopic Multiple Live Births                   Home Medications    Prior to Admission medications   Medication Sig Start Date End Date Taking? Authorizing Provider    Fluconazole (DIFLUCAN PO) Take by mouth.   Yes [provider]  metroNIDAZOLE (METROGEL) 0.75 % vaginal gel Place 1 Applicatorful vaginally 2 (two) times daily.   Yes [provider]  HYDROcodone-acetaminophen (NORCO/VICODIN) 5-325 MG tablet Take 1-2 tablets by mouth every 4 (four) hours as needed. 05/26/17   Nehemiah Settle, NP  Multiple Vitamins-Minerals (MULTIVITAMIN WITH MINERALS) tablet Take 1 tablet by mouth daily.    [provider]    Family History Family History  Problem Relation Age of Onset  . Hypertension Mother     Social History Social History  Substance Use Topics  . Smoking status: Never Smoker  . Smokeless tobacco: Never Used  . Alcohol use Yes     Comment: rarely     Allergies   Patient has no known allergies.   Review of Systems Review of Systems  Constitutional: Negative.  Negative for fatigue and fever.  HENT: Negative.   Eyes: Negative.  Negative for visual disturbance.  Respiratory: Negative.  Negative for cough and shortness of breath.   Cardiovascular: Negative.  Negative for chest pain and leg swelling.  Gastrointestinal: Positive for nausea. Negative for abdominal distention, abdominal pain, constipation and vomiting.  Endocrine: Negative.  Genitourinary: Positive for flank pain and vaginal discharge. Negative for decreased urine volume, difficulty urinating, dysuria, frequency, genital sores, hematuria, pelvic pain, vaginal bleeding and vaginal pain.  Musculoskeletal: Negative for gait problem and neck stiffness.  Skin: Negative.  Negative for color change.  Allergic/Immunologic: Negative.   Neurological: Negative.  Negative for dizziness and headaches.  Hematological: Negative.   Psychiatric/Behavioral: Negative.      Physical Exam Triage Vital Signs ED Triage Vitals  Enc Vitals Group     BP 05/26/17 1759 117/70     Pulse Rate 05/26/17 1759 64     Resp 05/26/17 1759 16     Temp 05/26/17 1759 98.5 F (36.9  C)     Temp Source 05/26/17 1759 Oral     SpO2 05/26/17 1759 100 %     Weight --      Height --      Head Circumference --      Peak Flow --      Pain Score 05/26/17 1801 7     Pain Loc --      Pain Edu? --      Excl. in [place]? --    No data found.   Updated Vital Signs BP 117/70   Pulse 64   Temp 98.5 F (36.9 C) (Oral)   Resp 16   LMP 05/02/2017   SpO2 100%   Breastfeeding? No    Physical Exam  Constitutional: She appears well-developed and well-nourished. No distress.  Eyes: Pupils are equal, round, and reactive to light. Conjunctivae are normal. Right eye exhibits no discharge. Left eye exhibits no discharge. No scleral icterus.  Neck: Normal range of motion. Neck supple. No thyromegaly present.  Cardiovascular: Normal rate, regular rhythm, normal heart sounds and intact distal pulses.  Exam reveals no gallop and no friction rub.   No murmur heard. Pulmonary/Chest: Effort normal and breath sounds normal. No respiratory distress. She has no wheezes. She has no rales. She exhibits no tenderness.  Abdominal: Soft. Bowel sounds are normal. She exhibits no distension and no mass. There is no tenderness. There is no rebound and no guarding. No hernia.  Positive CVA tenderness on the left.  Musculoskeletal: Normal range of motion. She exhibits no edema or deformity.  Range of motion, flexion and extension of back are intact. Patient is actually shuffling from side to side holding her left flank secondary to discomfort.  Skin: She is not diaphoretic.  Nursing note and vitals reviewed.   UC Treatments / Results  Labs (all labs ordered are listed, but only abnormal results are displayed) Labs Reviewed  POCT URINALYSIS DIP (DEVICE)   Results for orders placed or performed during the hospital encounter of 05/26/17  POCT urinalysis dip (device)  Result Value Ref Range   Glucose, UA NEGATIVE NEGATIVE mg/dL   Bilirubin Urine NEGATIVE NEGATIVE   Ketones, ur NEGATIVE NEGATIVE  mg/dL   Specific Gravity, Urine 1.020 1.005 - 1.030   Hgb urine dipstick NEGATIVE NEGATIVE   pH 5.5 5.0 - 8.0   Protein, ur NEGATIVE NEGATIVE mg/dL   Urobilinogen, UA 0.2 0.0 - 1.0 mg/dL   Nitrite NEGATIVE NEGATIVE   Leukocytes, UA NEGATIVE NEGATIVE    EKG  EKG Interpretation None       Radiology Dg Abd 2 Views  Result Date: 05/26/2017 CLINICAL DATA:  41 year old female with lower back and abdominal pain for the past 2 days, most severe on the left side. EXAM: ABDOMEN - 2 VIEW COMPARISON:  No priors. FINDINGS:  Gas and stool are seen scattered throughout the colon extending to the level of the distal rectum. No pathologic distension of small bowel is noted. Moderate volume of stool throughout the colon. No definite urinary tract calculi identified. No gross evidence of pneumoperitoneum. IMPRESSION: 1.  Nonobstructive bowel gas pattern. 2. No pneumoperitoneum. 3. Moderate volume of stool may suggest constipation. Electronically Signed   By: Vinnie Langton M.D.   On: 05/26/2017 20:22    Procedures Procedures (including critical care time)  Medications Ordered in UC Medications  ketorolac (TORADOL) injection 60 mg (60 mg Intramuscular Given 05/26/17 1901)     Initial Impression / Assessment and Plan / UC Course  I have reviewed the triage vital signs and the nursing notes.  Pertinent labs & imaging results that were available during my care of the patient were reviewed by me and considered in my medical decision making (see chart for details).     Based on the patient's presentation I feel this most likely that she has a kidney stone. Patient's urine shows no blood though or signs of infection. Discussed possibilities with patient and she opts for KUB to attempt to see a stone rather than wait an ED she is feeling "significantly better" following the Toradol. Patient states she can still feel discomfort in the back area but it is no longer radiating down the flank.  No evidence of  Renal calculi on film. Discussed constipation use of fiber or Mira lax and increased water.  Final Clinical Impressions(s) / UC Diagnoses   Final diagnoses:  Flank pain, acute    New Prescriptions New Prescriptions   HYDROCODONE-ACETAMINOPHEN (NORCO/VICODIN) 5-325 MG TABLET    Take 1-2 tablets by mouth every 4 (four) hours as needed.   Patient is to go to the emergency department tonight if the pain is unbearable and she cannot wait to follow up with her primary care provider in the morning. The usual and customary discharge instructions and warnings were given.  The patient verbalizes understanding and agrees to plan of care.     Controlled Substance Prescriptions [place] Controlled Substance Registry consulted? Yes, I have consulted the [place] Controlled Substances Registry for this patient, and feel the risk/benefit ratio today is favorable for proceeding with this prescription for a controlled substance.   Nehemiah Settle, NP 05/26/17 2032

## 2017-05-26 NOTE — ED Triage Notes (Signed)
Started with left mid-back pain yesterday without injury.  Has been taking 400mg  IBU, but pain returns.

## 2019-06-19 ENCOUNTER — Other Ambulatory Visit: Payer: Self-pay

## 2019-06-19 DIAGNOSIS — Z20822 Contact with and (suspected) exposure to covid-19: Secondary | ICD-10-CM

## 2019-06-20 LAB — NOVEL CORONAVIRUS, NAA: SARS-CoV-2, NAA: NOT DETECTED

## 2019-07-13 ENCOUNTER — Other Ambulatory Visit: Payer: Self-pay | Admitting: Obstetrics and Gynecology

## 2019-07-13 DIAGNOSIS — R928 Other abnormal and inconclusive findings on diagnostic imaging of breast: Secondary | ICD-10-CM

## 2019-07-24 ENCOUNTER — Ambulatory Visit
Admission: RE | Admit: 2019-07-24 | Discharge: 2019-07-24 | Disposition: A | Discharge status: Home / Self Care | Source: Ambulatory Visit | Attending: Obstetrics and Gynecology | Admitting: Obstetrics and Gynecology

## 2019-07-24 ENCOUNTER — Other Ambulatory Visit: Payer: Self-pay

## 2019-07-24 DIAGNOSIS — R928 Other abnormal and inconclusive findings on diagnostic imaging of breast: Secondary | ICD-10-CM

## 2019-07-24 IMAGING — MG MM DIGITAL DIAGNOSTIC UNILAT*R* W/ TOMO W/ CAD
4 series · 4 of 12 positions shown · non-contrast
Comparison: Previous exam(s).

CLINICAL DATA: Screening recall for a right breast asymmetry.

EXAM:
DIGITAL DIAGNOSTIC RIGHT MAMMOGRAM WITH CAD AND TOMO
ULTRASOUND RIGHT BREAST

[R CC synth-2D]
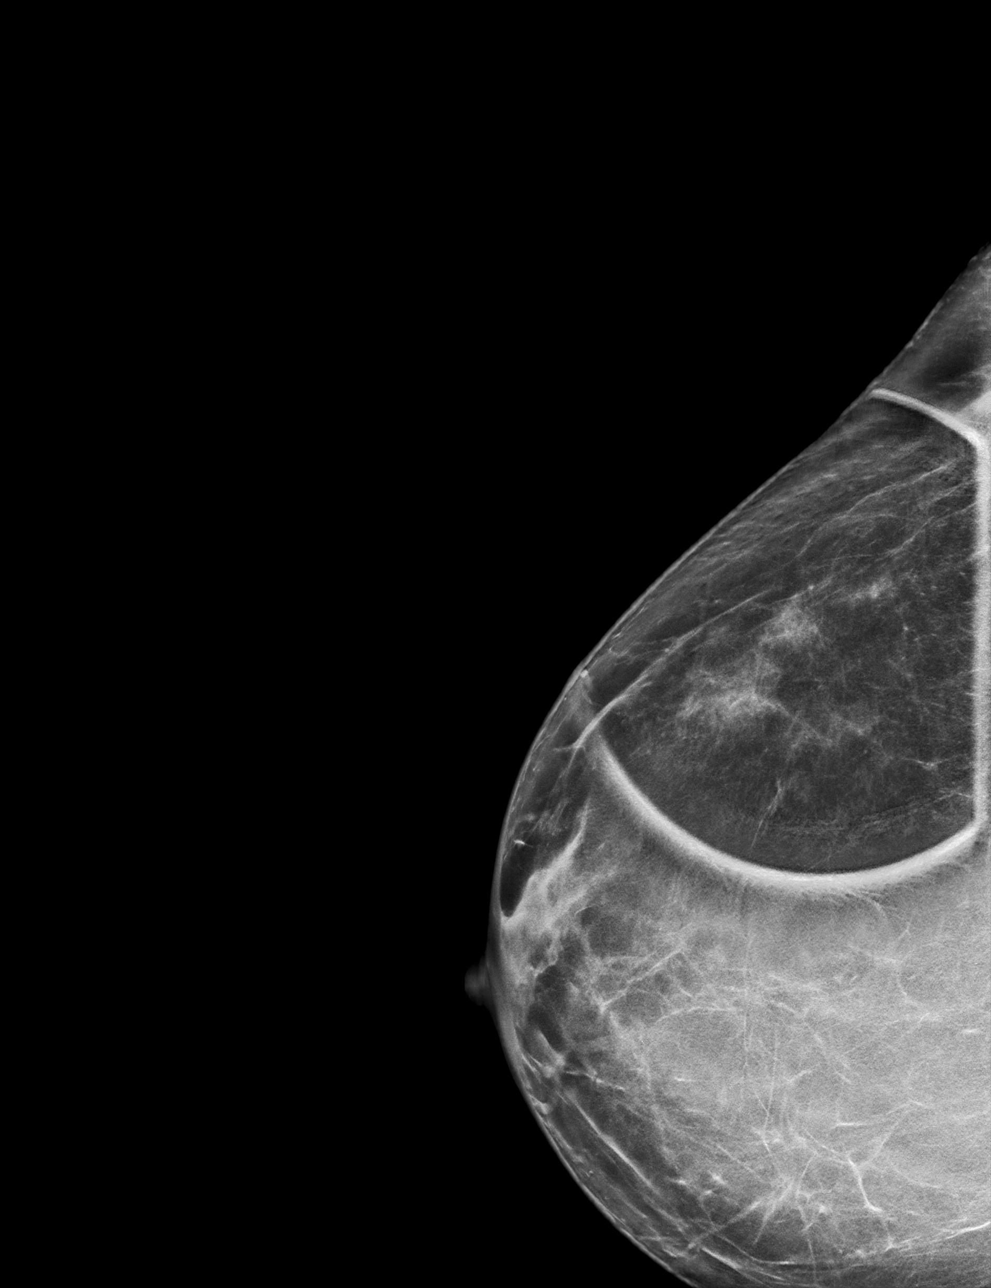

[R ML synth-2D]
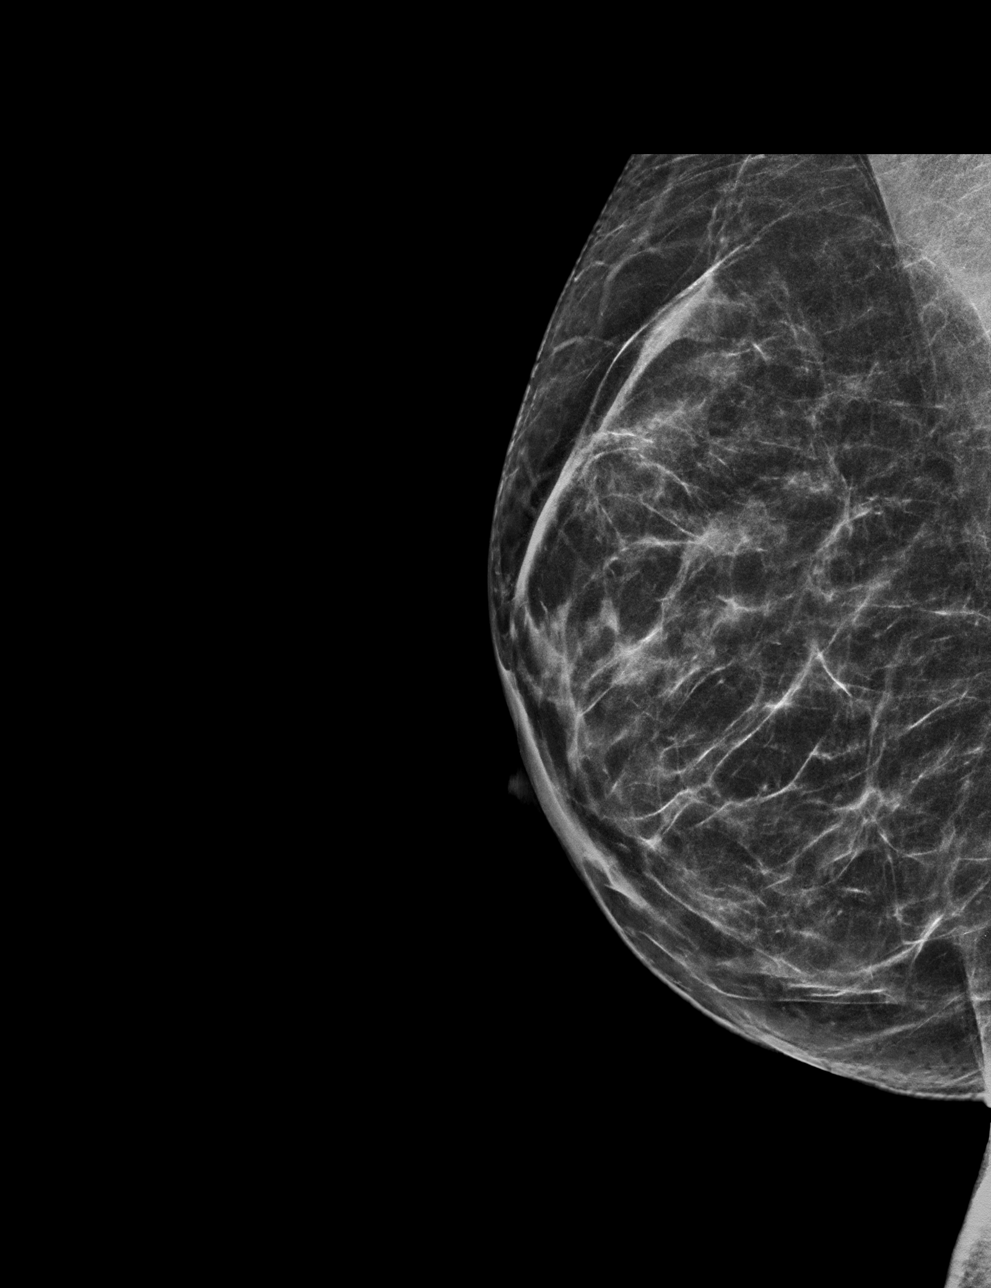

[R CC tomo · tomo slice 34/67.0]
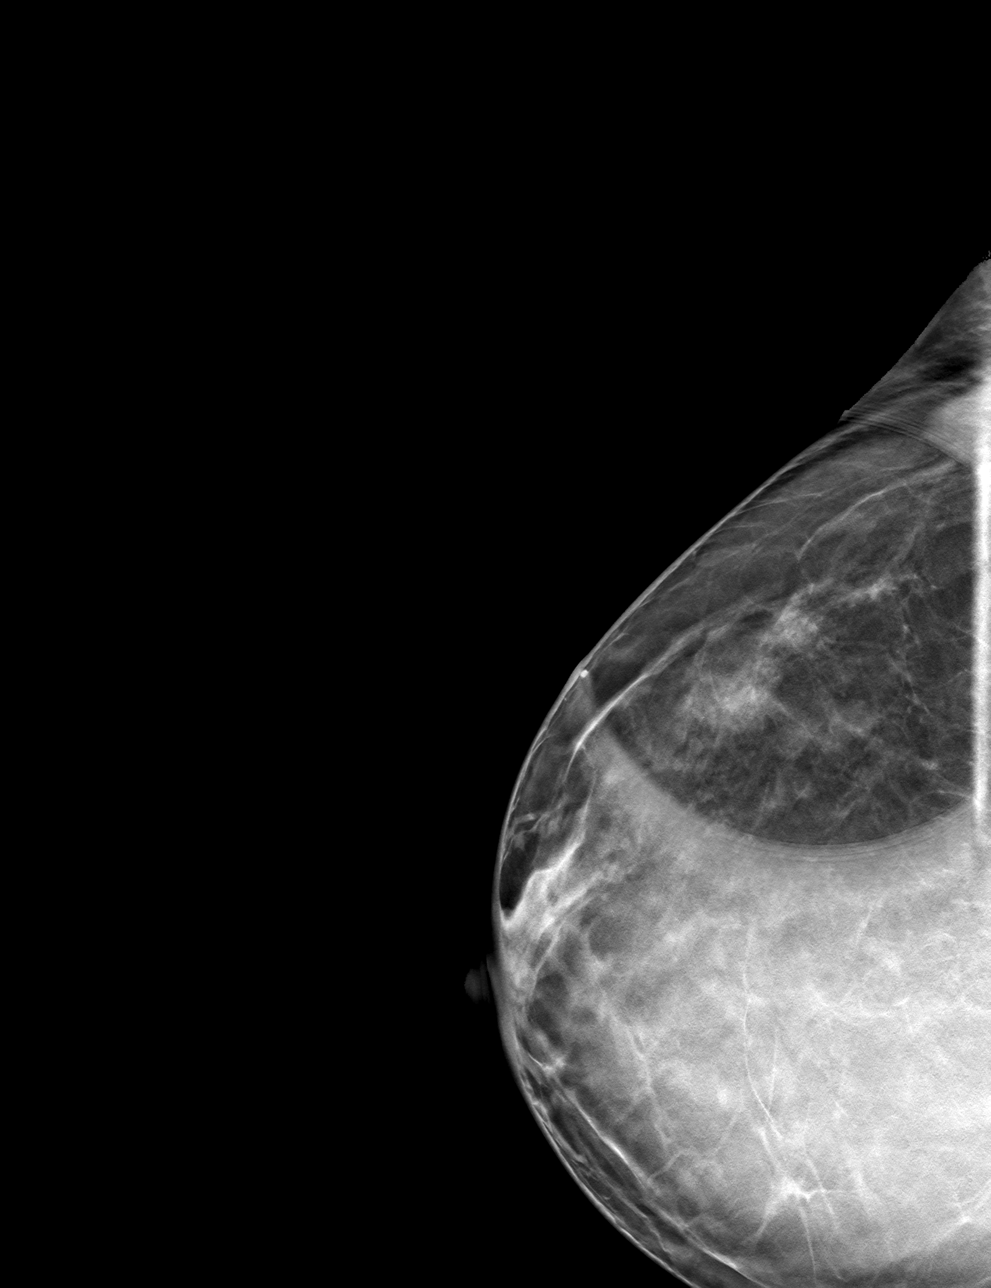

[R ML tomo · tomo slice 33/64.0]
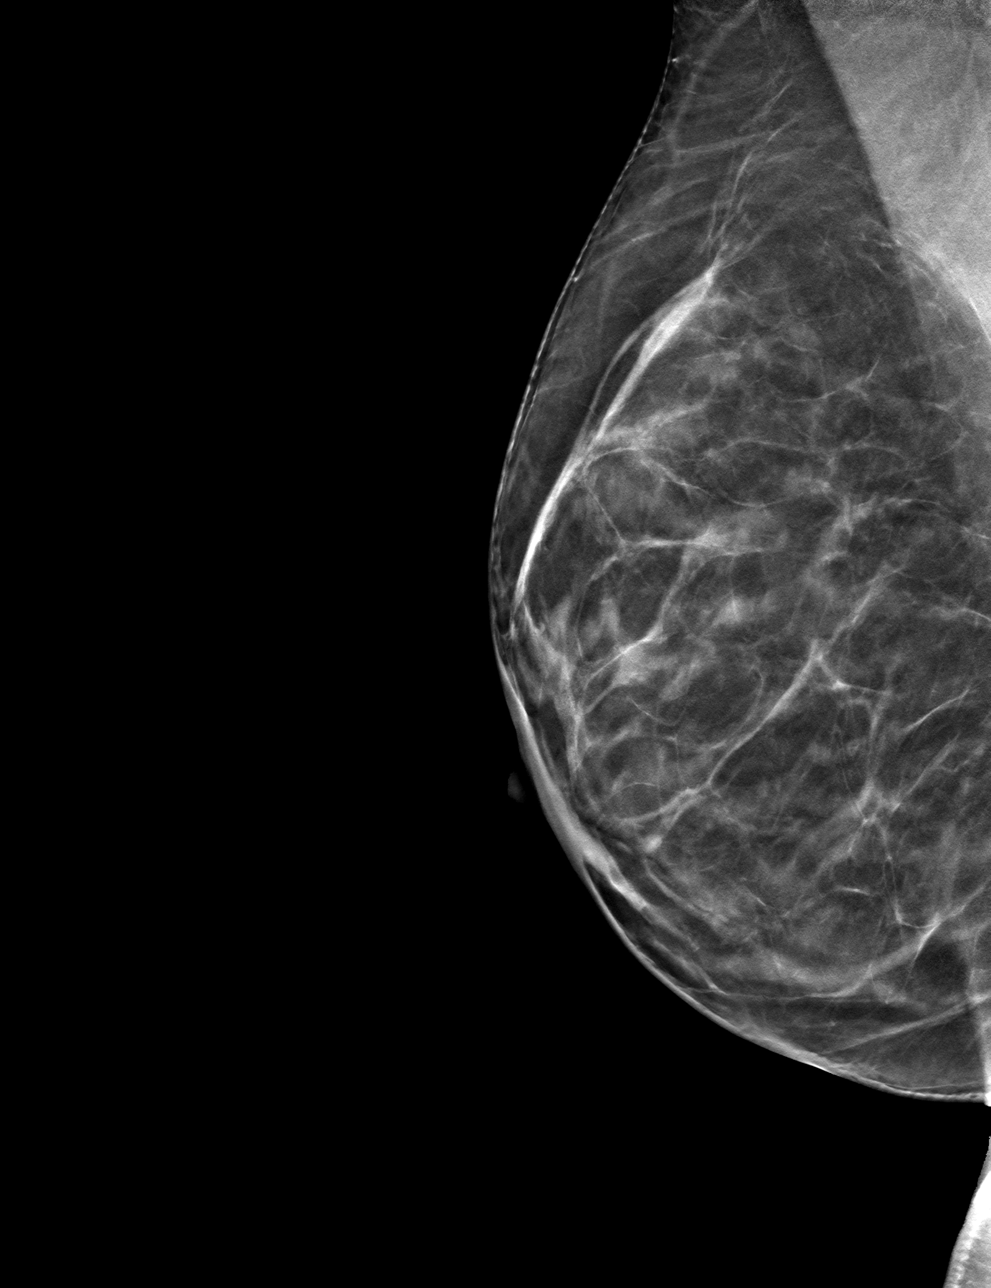

[4 of 12 positions shown; findings below may reference images not displayed]

ACR Breast Density Category c: The breast tissue is heterogeneously
dense, which may obscure small masses.
FINDINGS: Compression tomosynthesis images demonstrate no definite suspicious
masses or findings of distortion. Due to the density of the breast
tissue in this area, ultrasound will be performed for further
evaluation.

Mammographic images were processed with CAD.

Ultrasound of the right breast at 9 o'clock, 4 cm from the nipple
demonstrates a septated anechoic oval circumscribed mass measuring 9
x 4 x 7 mm.
IMPRESSION: There is a benign cyst in the lateral right breast.

RECOMMENDATION:
Screening mammogram in one year.(Code:[SF])

I have discussed the findings and recommendations with the patient.
If applicable, a reminder letter will be sent to the patient
regarding the next appointment.

BI-RADS CATEGORY  2: Benign.

## 2019-07-24 IMAGING — US US BREAST*R* LIMITED INC AXILLA
1 series · 8 of 8 positions shown · non-contrast
Comparison: Previous exam(s).

CLINICAL DATA: Screening recall for a right breast asymmetry.

EXAM:
DIGITAL DIAGNOSTIC RIGHT MAMMOGRAM WITH CAD AND TOMO
ULTRASOUND RIGHT BREAST

[Series 1: us breast*right* limited inc axilla · 0.06mm/px · 8 of 8 slices shown]
[im 1/8]
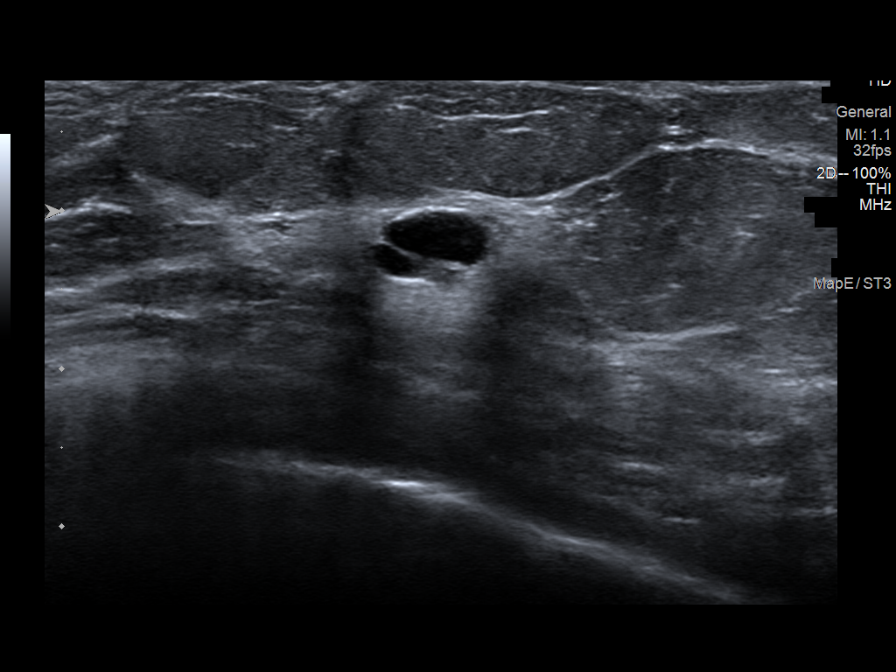
[im 2/8]
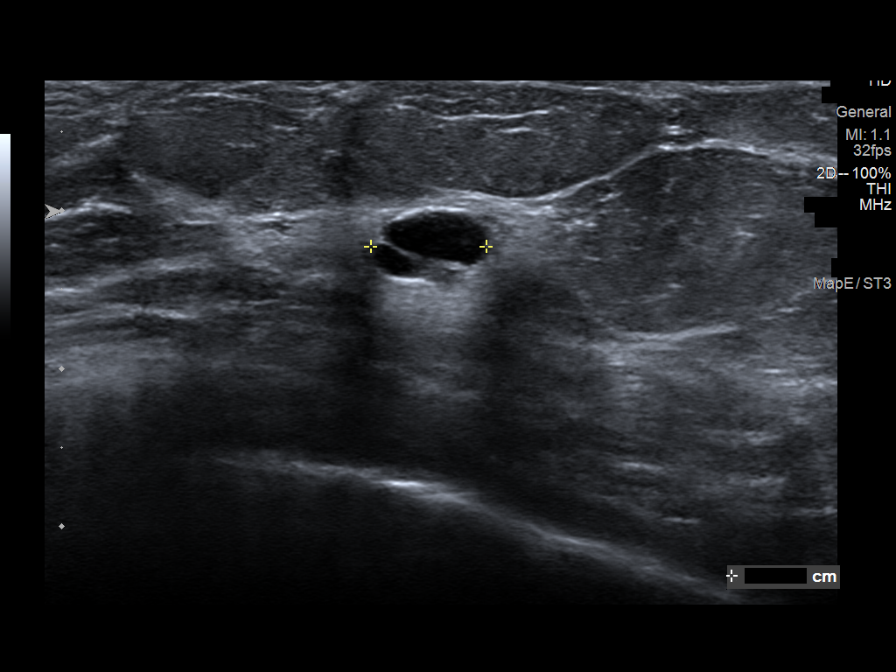
[im 3/8]
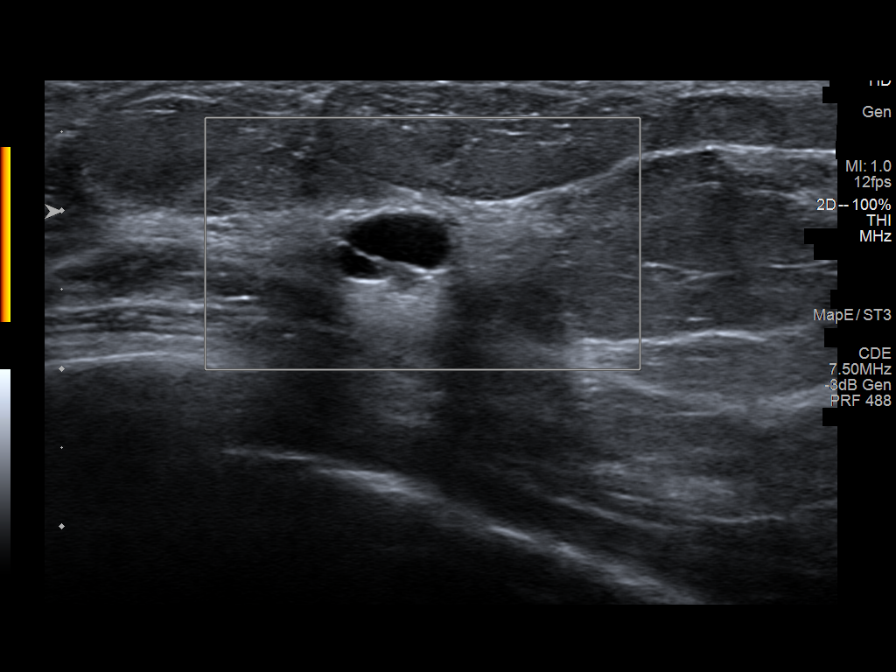
[im 4/8]
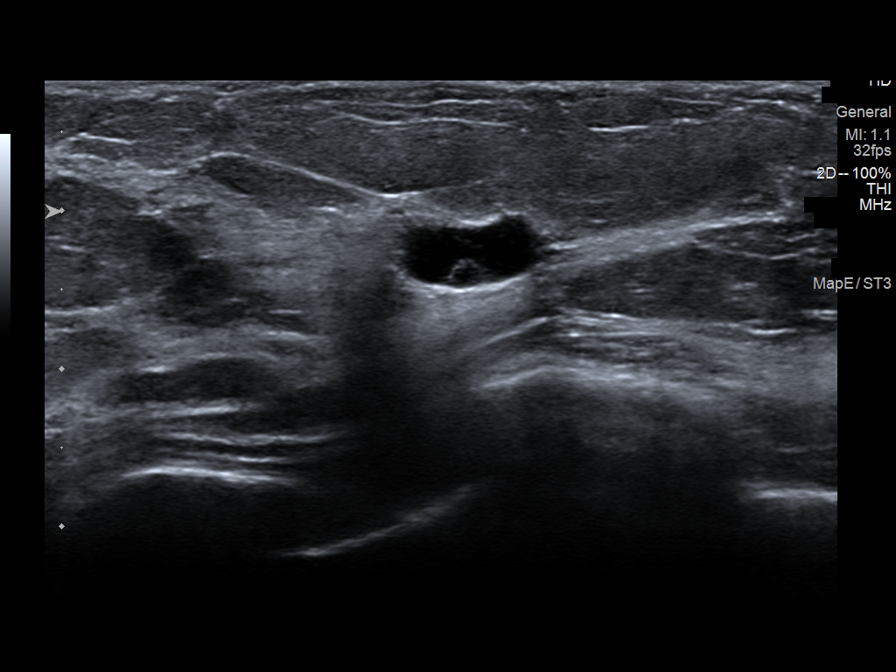
[im 5/8]
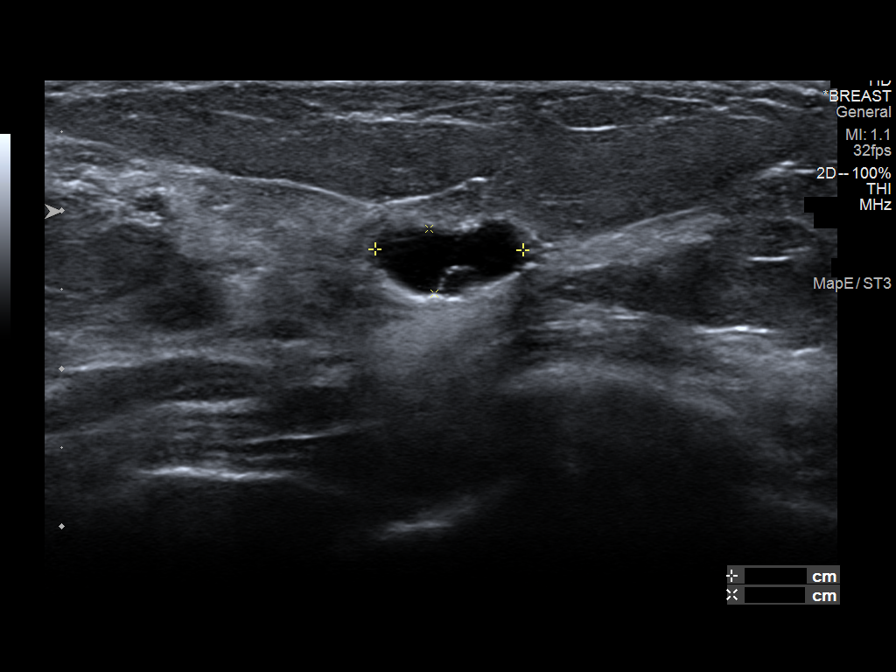
[im 6/8]
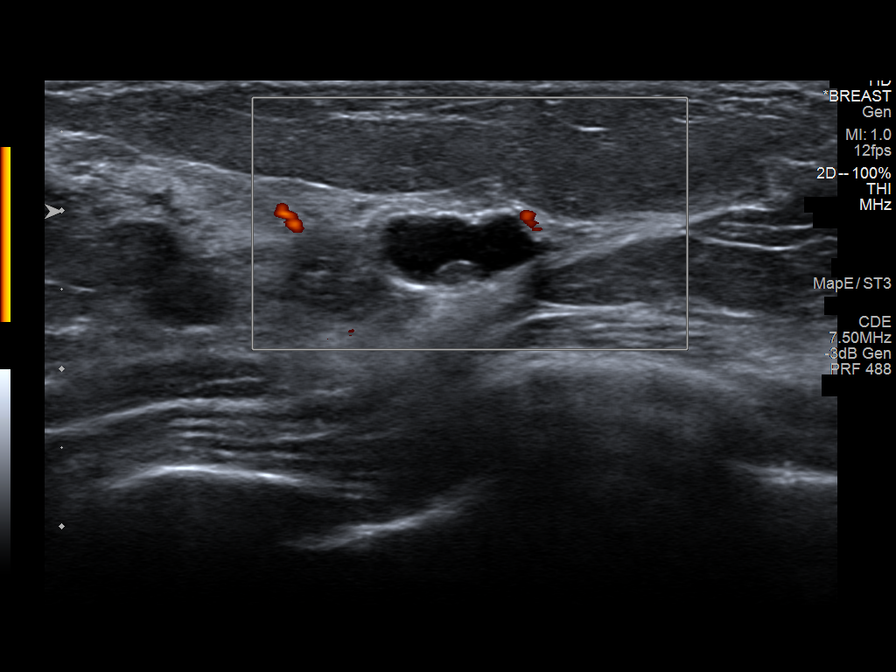
[im 7/8]
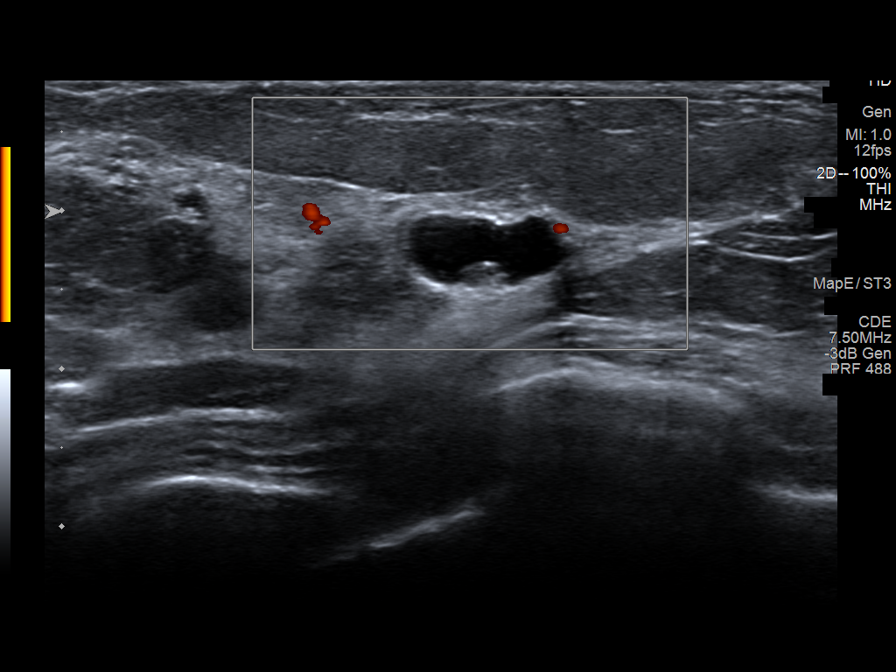
[im 8/8]
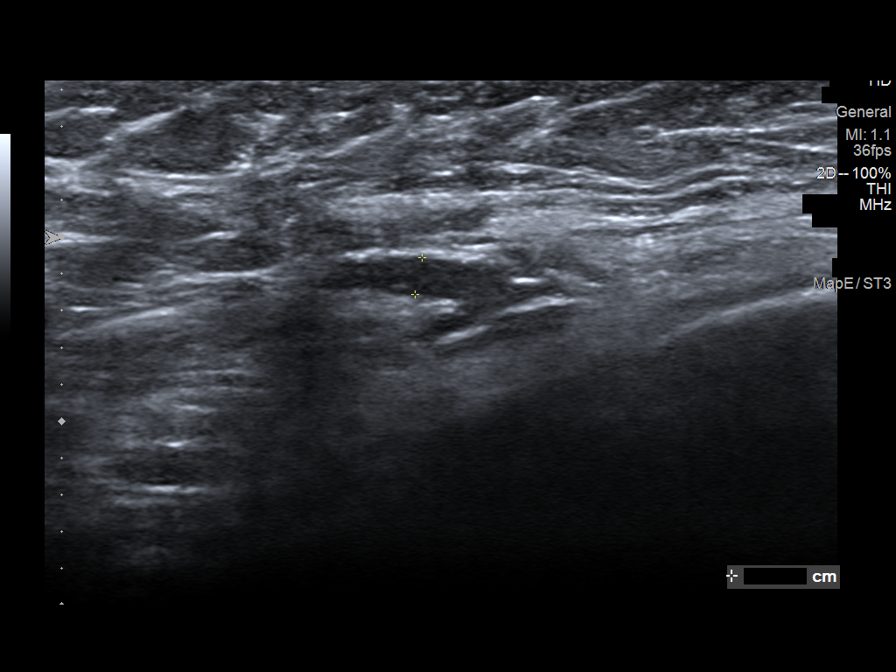

[8 of 8 positions shown; findings below may reference images not displayed]

ACR Breast Density Category c: The breast tissue is heterogeneously
dense, which may obscure small masses.
FINDINGS: Compression tomosynthesis images demonstrate no definite suspicious
masses or findings of distortion. Due to the density of the breast
tissue in this area, ultrasound will be performed for further
evaluation.

Mammographic images were processed with CAD.

Ultrasound of the right breast at 9 o'clock, 4 cm from the nipple
demonstrates a septated anechoic oval circumscribed mass measuring 9
x 4 x 7 mm.
IMPRESSION: There is a benign cyst in the lateral right breast.

RECOMMENDATION:
Screening mammogram in one year.(Code:[SF])

I have discussed the findings and recommendations with the patient.
If applicable, a reminder letter will be sent to the patient
regarding the next appointment.

BI-RADS CATEGORY  2: Benign.

## 2019-10-15 ENCOUNTER — Encounter (INDEPENDENT_AMBULATORY_CARE_PROVIDER_SITE_OTHER): Payer: Self-pay

## 2019-10-15 ENCOUNTER — Ambulatory Visit (INDEPENDENT_AMBULATORY_CARE_PROVIDER_SITE_OTHER): Admitting: Internal Medicine

## 2019-10-15 ENCOUNTER — Other Ambulatory Visit: Payer: Self-pay

## 2019-10-15 ENCOUNTER — Encounter: Payer: Self-pay | Admitting: Internal Medicine

## 2019-10-15 VITALS — BP 101/80 | HR 64 | Temp 97.2°F | Ht 66.0 in | Wt 142.8 lb

## 2019-10-15 DIAGNOSIS — M7989 Other specified soft tissue disorders: Secondary | ICD-10-CM

## 2019-10-15 DIAGNOSIS — R42 Dizziness and giddiness: Secondary | ICD-10-CM

## 2019-10-15 NOTE — Patient Instructions (Signed)
Medication Instructions:  Continue same medications   Lab Work: None ordered   Testing/Procedures: Schedule Echo  Follow-Up: At [facility], you and your health needs are our priority.  As part of our continuing mission to provide you with exceptional heart care, we have created designated Provider Care Teams.  These Care Teams include your primary Cardiologist (physician) and Advanced Practice Providers (APPs -  Physician Assistants and Nurse Practitioners) who all work together to provide you with the care you need, when you need it.  Your next appointment:  Schedule after Echo   The format for your next appointment:  Office or Virtual   Provider:  Dr.Acharya

## 2019-10-15 NOTE — Progress Notes (Signed)
Cardiology Office Note:    Date:  10/15/2019   ID:  Tricia Davis, DOB [birth year], MRN [id number]  PCP:  Tricia Arabian, MD  Cardiologist:  No primary care provider on file.  Electrophysiologist:  None   Referring MD: Tricia Arabian, MD   Chief Complaint: Extremity swelling and hypotension with positional dizziness.  History of Present Illness:    Tricia Davis is a 44 y.o. female with no significant past cardiovascular history who presents today for evaluation of hand and feet swelling, hypotension, and positional dizziness.  She tells me that over the past 6 to 8 years she has had swelling of her fingers and toes with taut skin, the swelling will last for a week and then go away on its own, this can happen several times a month but has been increasing lately.  She has no pedal or lower extremity swelling, no history of DVT/PE.  She had an urinalysis performed in 2018 which was negative for proteinuria.  She also notes positional dizziness and orthostatic symptoms.  She went to her dentist recently and was found to have a blood pressure with systolic in the [id number].  When she ties her shoes and stands up she only sees stars and takes a few seconds to recover.  This symptom has been increasing lately.  She follows a healthy diet which is low in salt and exercises by walking 3 times a week.  She has no lifetime syncope.  Her son who is 65 years old has seen a cardiologist at [facility] upon the referral of his pediatrician.  She does not know what diagnosis he was given but was told "there was a flap in his heart that was supposed to close that did not close".  In addition given her ancestry ([place]) the pediatric cardiologist recommended consideration for genetic screening based on the results of her son's testing.  I have asked her to obtain these records and if she would be willing to share them we can further evaluate based on her son's test results.  She used to  ballroom dance and continues to do fencing.  She has no exercise intolerance, and does not feel limited in her activity.  She denies chest pain, shortness of breath, palpitations.  She does endorse PND and orthopnea particularly if she is laying on her back.  Denies snoring, denies apnea.  Right toes swell more than left, no leg edema noted.  Abdominal surgeries  She has 4 healthy 46-year-old son and has had 5 miscarriages.  Has had uterine abdominal surgery.   Past Medical History:  Diagnosis Date  . Bacterial vaginosis   . Lipoma of other specified sites   . Lumbago     Past Surgical History:  Procedure Laterality Date  . CESAREAN SECTION  02/09/2011   x 1  . DILATION AND CURETTAGE OF UTERUS     x 3 for MAB  . DILATION AND EVACUATION  03/28/2012   Procedure: DILATATION AND EVACUATION;  Surgeon: Darlyn Chamber, MD;  Location: [facility];  Service: Gynecology;  Laterality: N/A;  chromosome studies  . dnc  06/2010  . MASS EXCISION  09/25/2012   Procedure: MINOR EXCISION OF MASS;  Surgeon: Odis Hollingshead, MD;  Location: [facility];  Service: General;  Laterality: N/A;  remove soft tissue mass on upper back 5.5 cm  . WISDOM TOOTH EXTRACTION      Current Medications: Current Meds  Medication Sig  . Fluconazole (DIFLUCAN PO) Take by  mouth.  Marland Kitchen HYDROcodone-acetaminophen (NORCO/VICODIN) 5-325 MG tablet Take 1-2 tablets by mouth every 4 (four) hours as needed.  . metroNIDAZOLE (METROGEL) 0.75 % vaginal gel Place 1 Applicatorful vaginally 2 (two) times daily.  . Multiple Vitamins-Minerals (MULTIVITAMIN WITH MINERALS) tablet Take 1 tablet by mouth daily.     Allergies:   Patient has no known allergies.   Social History   Socioeconomic History  . Marital status: Married    Spouse name: Not on file  . Number of children: Not on file  . Years of education: Not on file  . Highest education level: Not on file  Occupational History  . Not on file  Tobacco Use  . Smoking  status: Never Smoker  . Smokeless tobacco: Never Used  Substance and Sexual Activity  . Alcohol use: Yes    Comment: rarely  . Drug use: No  . Sexual activity: Not on file  Other Topics Concern  . Not on file  Social History Narrative  . Not on file   Social Determinants of Health   Financial Resource Strain:   . Difficulty of Paying Living Expenses: Not on file  Food Insecurity:   . Worried About Charity fundraiser in the Last Year: Not on file  . Ran Out of Food in the Last Year: Not on file  Transportation Needs:   . Lack of Transportation (Medical): Not on file  . Lack of Transportation (Non-Medical): Not on file  Physical Activity:   . Days of Exercise per Week: Not on file  . Minutes of Exercise per Session: Not on file  Stress:   . Feeling of Stress : Not on file  Social Connections:   . Frequency of Communication with Friends and Family: Not on file  . Frequency of Social Gatherings with Friends and Family: Not on file  . Attends Religious Services: Not on file  . Active Member of Clubs or Organizations: Not on file  . Attends Archivist Meetings: Not on file  . Marital Status: Not on file     Family History: The patient's family history includes Breast cancer in her paternal aunt; Hypertension in her mother.  ROS:   Please see the history of present illness.    All other systems reviewed and are negative.  EKGs/Labs/Other Studies Reviewed:    The following studies were reviewed today:  EKG: Normal sinus rhythm, rate 64, normal PR interval, low voltage QRS  Recent Labs: No results found for requested labs within last 8760 hours.  Recent Lipid Panel No results found for: CHOL, TRIG, HDL, CHOLHDL, VLDL, LDLCALC, LDLDIRECT  Physical Exam:    VS:  BP 101/80   Pulse 64   Temp (!) 97.2 F (36.2 C)   Ht 5\' 6"  (1.676 m)   Wt 142 lb 12.8 oz (64.8 kg)   SpO2 100%   BMI 23.05 kg/m     Wt Readings from Last 5 Encounters:  10/15/19 142 lb 12.8  oz (64.8 kg)  02/16/13 142 lb 6.4 oz (64.6 kg)  10/20/12 141 lb (64 kg)  09/15/12 141 lb 3.2 oz (64 kg)  03/28/12 145 lb (65.8 kg)     Constitutional: No acute distress Eyes: sclera non-icteric, normal conjunctiva and lids ENMT: normal dentition, moist mucous membranes Cardiovascular: regular rhythm, normal rate, no murmurs. S1 and S2 normal. Radial pulses normal bilaterally. No jugular venous distention.  Respiratory: clear to auscultation bilaterally GI : normal bowel sounds, soft and nontender. No distention.  MSK: extremities warm, well perfused. No edema.  NEURO: grossly nonfocal exam, moves all extremities. PSYCH: alert and oriented x 3, normal mood and affect.   ASSESSMENT:    1. Dizziness   2. Positional lightheadedness   3. Finger swelling    PLAN:    Resting hypotension/dizziness/positional lightheadedness-unclear what this represents.  Per primary care doctor's notes there may be a previous mention of hypertrophic cardiomyopathy.  In addition her son has been seen by pediatric cardiologist and I do not have access to those records at the moment but there is some concern for need for screening in our patient baseline evaluation.  For these reasons we will obtain an echocardiogram.  No evidence of LVH on ECG.  Finger swelling-she has had a urinalysis in the last 2 years that was negative for proteinuria, and she was experiencing the symptoms 2 years ago as well.  Could consider repeating a urinalysis, this can be coordinated by her primary care physician, we are happy to perform this as needed.  No definite symptoms suggestive of vasculitis, however with her history of multiple miscarriages, we may need to consider.  Son with cardiovascular history-she will obtain the records from her son's pediatric cardiologist both for his cardiac defect as well as to better understand their concerns regarding her heritage and possibility of a genetic syndrome.  Once we have reviewed this,  we can consider genetic consultation.  I discussed this with the patient today.  Hypotension-liberalize salt intake, wear compression garments, stay well-hydrated.  Total time of encounter: 40 minutes total time of encounter, including 30 minutes spent in face-to-face patient care. This time includes coordination of care and counseling regarding work-up of extremity swelling and lightheadedness. Remainder of non-face-to-face time involved reviewing chart documents/testing relevant to the patient encounter and documentation in the medical record.  Cherlynn Kaiser, MD [place]  [facility]   Medication Adjustments/Labs and Tests Ordered: Current medicines are reviewed at length with the patient today.  Concerns regarding medicines are outlined above.  Orders Placed This Encounter  Procedures  . EKG 12-Lead  . ECHOCARDIOGRAM COMPLETE   No orders of the defined types were placed in this encounter.   Patient Instructions  Medication Instructions:  Continue same medications   Lab Work: None ordered   Testing/Procedures: Schedule Echo  Follow-Up: At [facility], you and your health needs are our priority.  As part of our continuing mission to provide you with exceptional heart care, we have created designated Provider Care Teams.  These Care Teams include your primary Cardiologist (physician) and Advanced Practice Providers (APPs -  Physician Assistants and Nurse Practitioners) who all work together to provide you with the care you need, when you need it.  Your next appointment:  Schedule after Echo   The format for your next appointment:  Office or Virtual   Provider:  Dr.Sterling Ucci

## 2019-10-26 ENCOUNTER — Ambulatory Visit (HOSPITAL_COMMUNITY): Attending: Cardiovascular Disease

## 2019-10-26 ENCOUNTER — Other Ambulatory Visit: Payer: Self-pay

## 2019-10-26 DIAGNOSIS — R42 Dizziness and giddiness: Secondary | ICD-10-CM

## 2019-10-28 ENCOUNTER — Telehealth (INDEPENDENT_AMBULATORY_CARE_PROVIDER_SITE_OTHER): Admitting: Internal Medicine

## 2019-10-28 VITALS — BP 108/65 | Ht 66.0 in | Wt 142.0 lb

## 2019-10-28 DIAGNOSIS — M7989 Other specified soft tissue disorders: Secondary | ICD-10-CM | POA: Diagnosis not present

## 2019-10-28 DIAGNOSIS — R42 Dizziness and giddiness: Secondary | ICD-10-CM

## 2019-10-28 NOTE — Progress Notes (Signed)
Virtual Visit via Telephone Note   This visit type was conducted due to national recommendations for restrictions regarding the COVID-19 Pandemic (e.g. social distancing) in an effort to limit this patient's exposure and mitigate transmission in our community.  Due to her co-morbid illnesses, this patient is at least at moderate risk for complications without adequate follow up.  This format is felt to be most appropriate for this patient at this time.  The patient did not have access to video technology/had technical difficulties with video requiring transitioning to audio format only (telephone).  All issues noted in this document were discussed and addressed.  No physical exam could be performed with this format.  Please refer to the patient's chart for her  consent to telehealth for [facility]   Date:  10/28/2019   ID:  Tricia Davis, DOB June 13, 1976, MRN [id number]  Patient Location: Home Provider Location: Home  PCP:  Gaynelle Arabian, MD  Cardiologist:  No primary care provider on file.  Electrophysiologist:  None   Evaluation Performed:  Follow-Up Visit  Chief Complaint:  Extremity swelling and hypotension with positional dizziness.  History of Present Illness:    Tricia Davis is a 44 y.o. female with no significant past cardiovascular history who presents for follow up of digit swelling. She tells me that she has had finger and toe swelling intermittently since our last visit. She describes the swelling as causing her fingers to look sausage like, and causes the skin to peel.  We reviewed that her echocardiogram was normal.   Discussed further workup including possible referral to rheumatology. No significant joint pain or joint swelling.   The patient does not have symptoms concerning for COVID-19 infection (fever, chills, cough, or new shortness of breath).    Past Medical History:  Diagnosis Date  . Bacterial vaginosis   . Lipoma of other  specified sites   . Lumbago    Past Surgical History:  Procedure Laterality Date  . CESAREAN SECTION  02/09/2011   x 1  . DILATION AND CURETTAGE OF UTERUS     x 3 for MAB  . DILATION AND EVACUATION  03/28/2012   Procedure: DILATATION AND EVACUATION;  Surgeon: Darlyn Chamber, MD;  Location: [facility];  Service: Gynecology;  Laterality: N/A;  chromosome studies  . dnc  06/2010  . MASS EXCISION  09/25/2012   Procedure: MINOR EXCISION OF MASS;  Surgeon: Odis Hollingshead, MD;  Location: [facility];  Service: General;  Laterality: N/A;  remove soft tissue mass on upper back 5.5 cm  . WISDOM TOOTH EXTRACTION       Current Meds  Medication Sig  . Multiple Vitamins-Minerals (MULTIVITAMIN WITH MINERALS) tablet Take 1 tablet by mouth daily.     Allergies:   Patient has no known allergies.   Social History   Tobacco Use  . Smoking status: Never Smoker  . Smokeless tobacco: Never Used  Substance Use Topics  . Alcohol use: Yes    Comment: rarely  . Drug use: No     Family Hx: The patient's family history includes Breast cancer in her paternal aunt; Hypertension in her mother.  ROS:   Please see the history of present illness.     All other systems reviewed and are negative.   Prior CV studies:   The following studies were reviewed today:  echocardiogram  Labs/Other Tests and Data Reviewed:    EKG:  No ECG reviewed.  Recent Labs: No results found  for requested labs within last 8760 hours.   Recent Lipid Panel No results found for: CHOL, TRIG, HDL, CHOLHDL, LDLCALC, LDLDIRECT  Wt Readings from Last 3 Encounters:  10/28/19 142 lb (64.4 kg)  10/15/19 142 lb 12.8 oz (64.8 kg)  02/16/13 142 lb 6.4 oz (64.6 kg)     Objective:    Vital Signs:  BP 108/65   Ht 5\' 6"  (1.676 m)   Wt 142 lb (64.4 kg)   BMI 22.92 kg/m    VITAL SIGNS:  reviewed GEN:  no acute distress NEURO:  alert and oriented x 3, no obvious focal deficit PSYCH:  normal affect  ASSESSMENT &  PLAN:    1. Finger swelling   2. Positional lightheadedness   3. Dizziness    Digit swelling - I remain suspicious for a protein losing process or inflammatory condition that may be contributing to digit swelling. No definite cardiovascular cause identified. Recommend PCP follow up as well as rheumatology evaluation. Consider repeat urinalysis for protein, which can be coordinated with PCP. Patient understands and will return for follow up in cardiology as needed, if lightheadedness and dizziness recur or worsen.   COVID-19 Education: The signs and symptoms of COVID-19 were discussed with the patient and how to seek care for testing (follow up with PCP or arrange E-visit).  The importance of social distancing was discussed today.  Time:   Today, I have spent 20 minutes with the patient with telehealth technology discussing the above problems.     Medication Adjustments/Labs and Tests Ordered: Current medicines are reviewed at length with the patient today.  Concerns regarding medicines are outlined above.   Tests Ordered: No orders of the defined types were placed in this encounter.   Medication Changes: No orders of the defined types were placed in this encounter.   Follow Up: prn  Signed, Elouise Munroe, MD  10/28/2019 9:09 AM    [place]

## 2019-10-28 NOTE — Patient Instructions (Signed)
Medication Instructions:  Your physician recommends that you continue on your current medications as directed. Please refer to the Current Medication list given to you today.  Lab Work: NONE  Testing/Procedures: NONE  Follow-Up: At [facility], you and your health needs are our priority.  As part of our continuing mission to provide you with exceptional heart care, we have created designated Provider Care Teams.  These Care Teams include your primary Cardiologist (physician) and Advanced Practice Providers (APPs -  Physician Assistants and Nurse Practitioners) who all work together to provide you with the care you need, when you need it.  Your next appointment:   AS NEEDED WITH DR. Margaretann Loveless

## 2019-12-10 ENCOUNTER — Ambulatory Visit: Attending: Internal Medicine

## 2019-12-22 ENCOUNTER — Other Ambulatory Visit: Payer: Self-pay | Admitting: Obstetrics and Gynecology

## 2019-12-22 DIAGNOSIS — Z9189 Other specified personal risk factors, not elsewhere classified: Secondary | ICD-10-CM

## 2020-01-18 ENCOUNTER — Ambulatory Visit
Admission: RE | Admit: 2020-01-18 | Discharge: 2020-01-18 | Disposition: A | Discharge status: Home / Self Care | Payer: No Typology Code available for payment source | Source: Ambulatory Visit | Attending: Obstetrics and Gynecology | Admitting: Obstetrics and Gynecology

## 2020-01-18 ENCOUNTER — Other Ambulatory Visit: Payer: Self-pay

## 2020-01-18 DIAGNOSIS — Z9189 Other specified personal risk factors, not elsewhere classified: Secondary | ICD-10-CM

## 2020-01-18 IMAGING — MR MR BREAST WO/W CM  BILAT
4 of 5 series · 27 of 48 positions shown · IV contrast (gadavist)
Comparison: Previous exam(s).

CLINICAL DATA: Abbreviated Breast MRI for breast cancer screening.

LABS:  None performed on site.
EXAM:
BILATERAL ABBREVIATED BREAST MRI WITH AND WITHOUT CONTRAST
TECHNIQUE: Multiplanar, multisequence MR images of both breasts were obtained
prior to and following the intravenous administration of 10 ml of
Gadavist.

[Series 3: t2_tirm_tra ipat (a-p) · axial · 3.0mm · 0.70mm/px · z∈[-90,+87]mm · 5 of 60 slices shown]
[im 1/60]
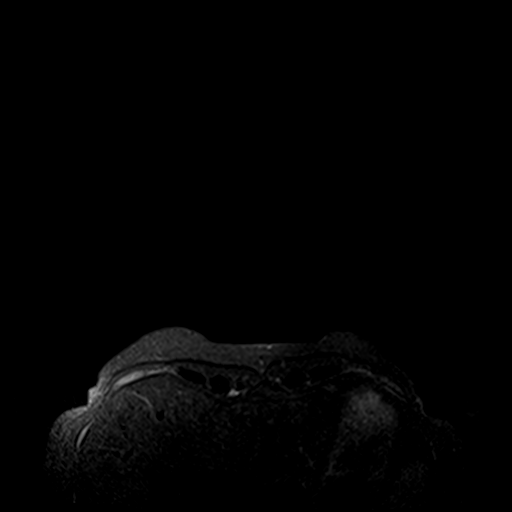
[im 15/60]
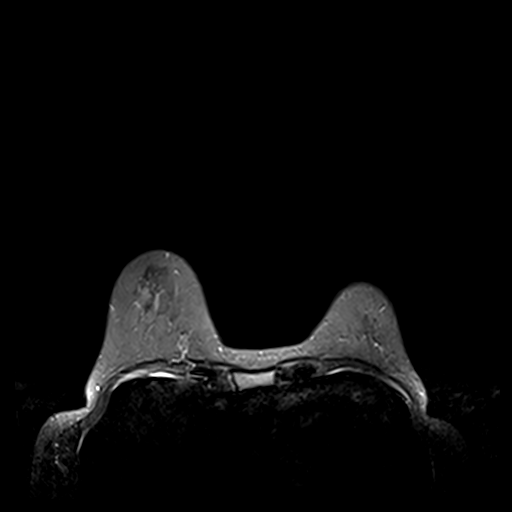
[im 30/60]
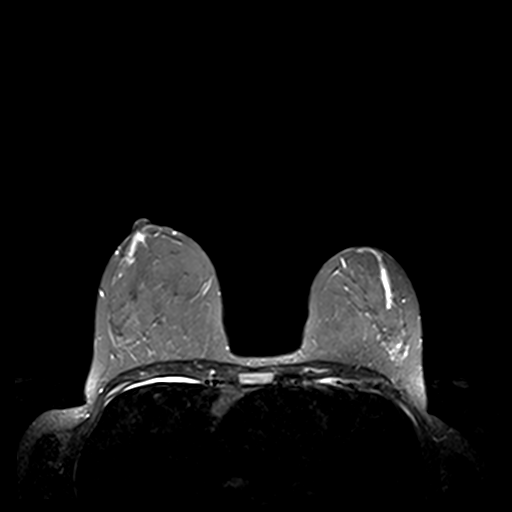
[im 45/60]
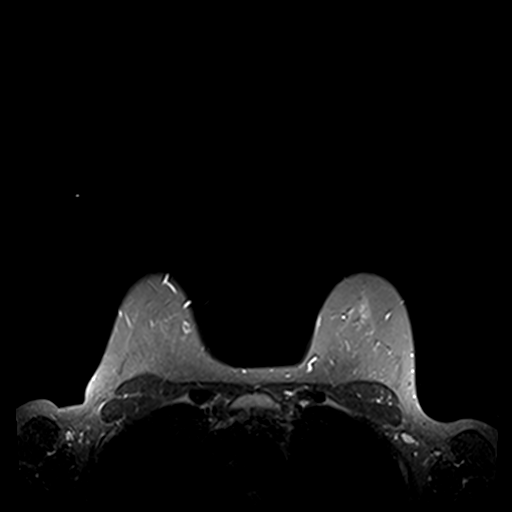
[im 60/60]
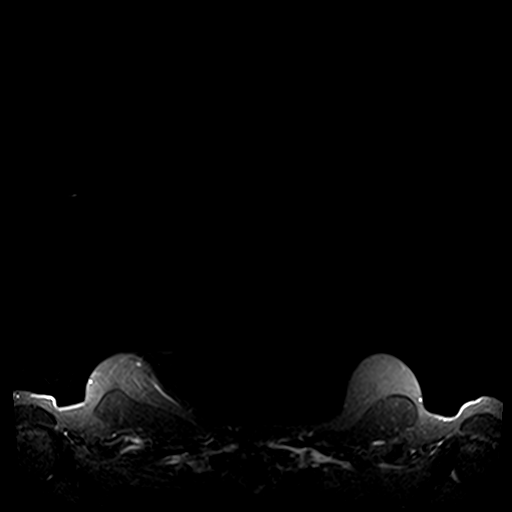

[Series 4: fl3d pre-cm · axial · non-contrast · 0.9mm · 0.87mm/px · z∈[-87,+85]mm · 8 of 192 slices shown]
[im 1/192]
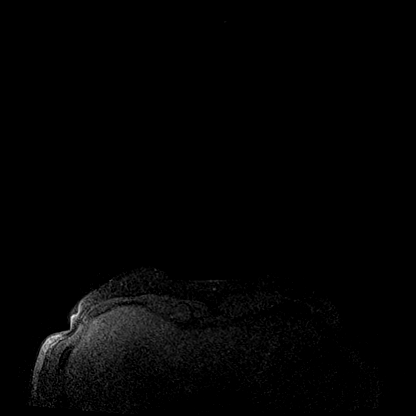
[im 30/192]
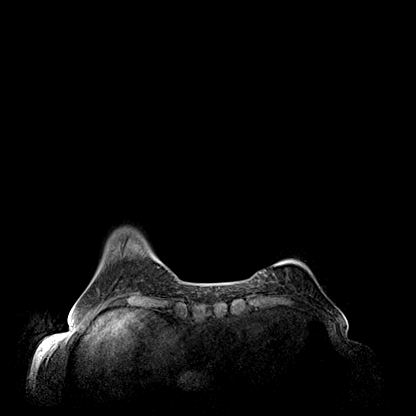
[im 59/192]
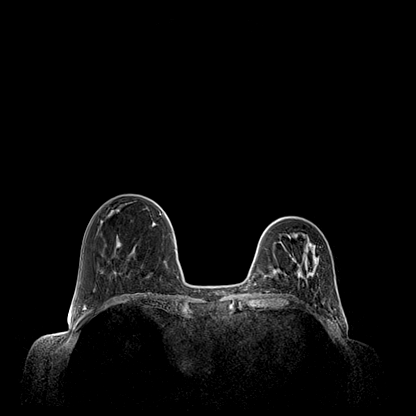
[im 89/192]
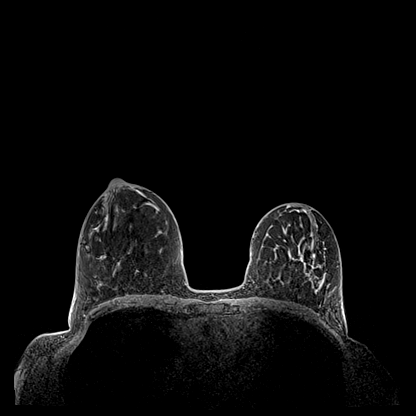
[im 103/192]
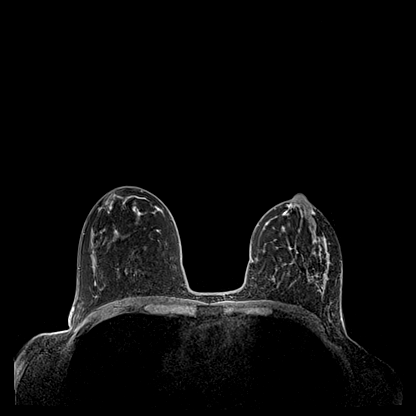
[im 133/192]
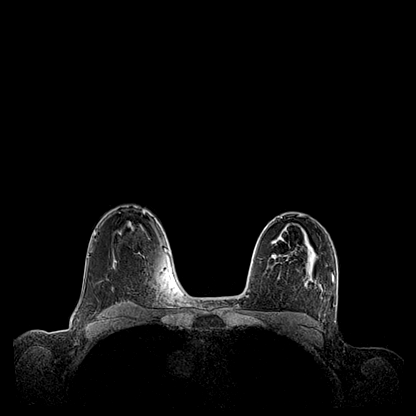
[im 162/192]
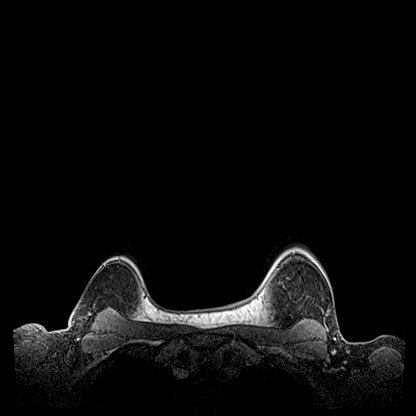
[im 192/192]
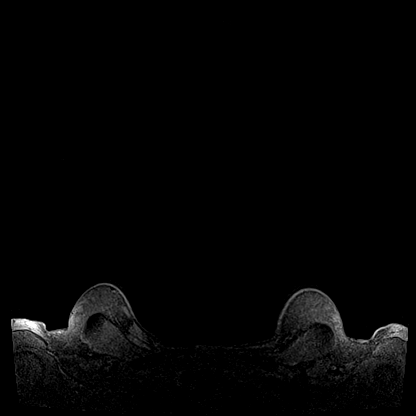

[Series 5: fl3d post-cm 20 · axial · 0.9mm · 0.87mm/px · z∈[-87,+85]mm · 8 of 192 slices shown (1 of 2)]
[im 1/192]
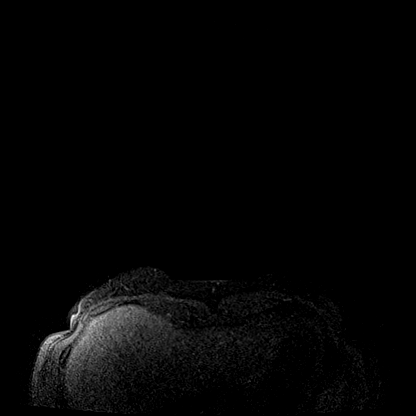
[im 30/192]
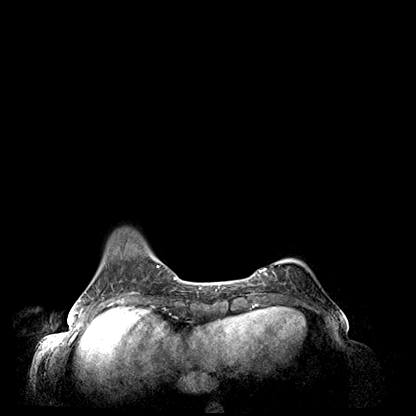
[im 59/192]
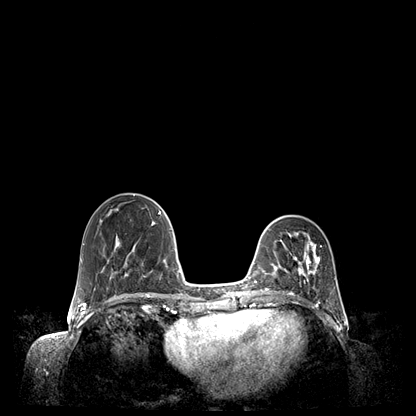
[im 89/192]
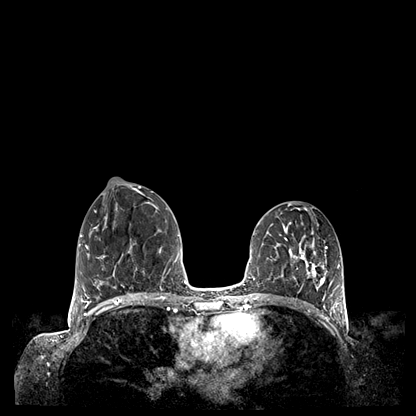
[im 103/192]
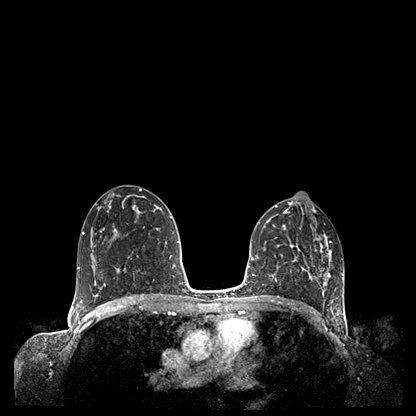
[im 133/192]
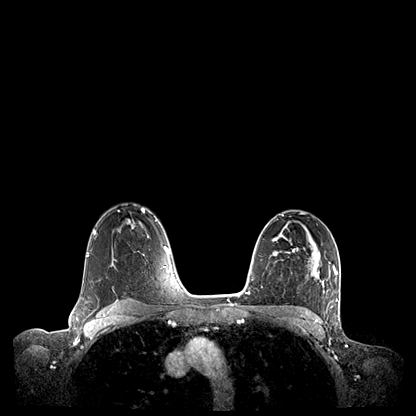
[im 162/192]
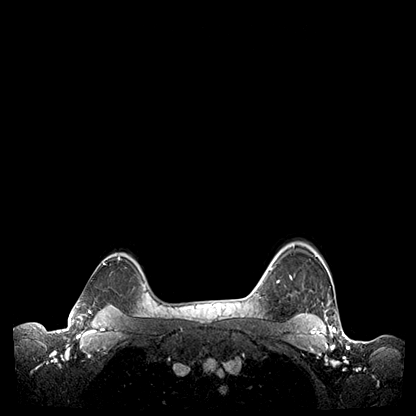
[im 192/192]
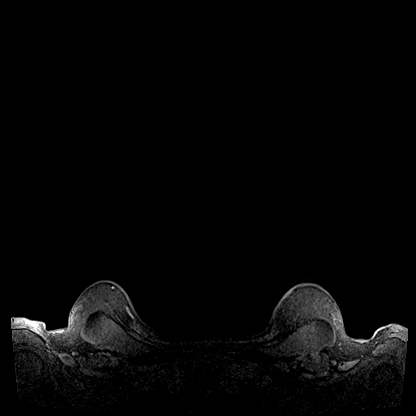

[Series 6: fl3d post-cm 20 · axial · 0.9mm · 0.87mm/px · z∈[-87,+58]mm · 6 of 192 slices shown (2 of 2)]
[im 1/192]
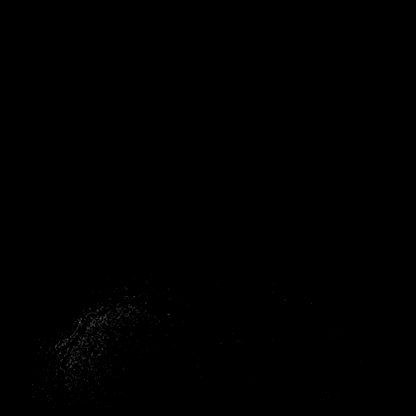
[im 30/192]
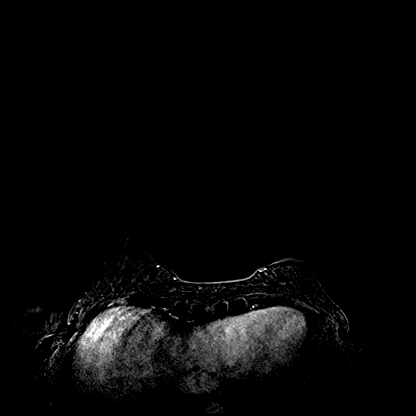
[im 59/192]
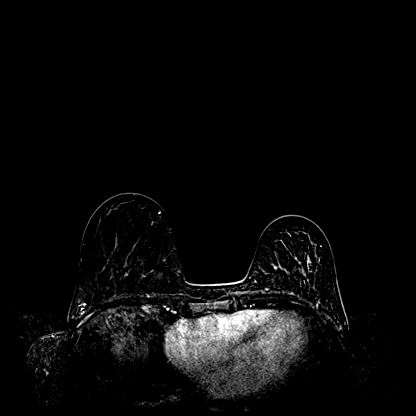
[im 89/192]
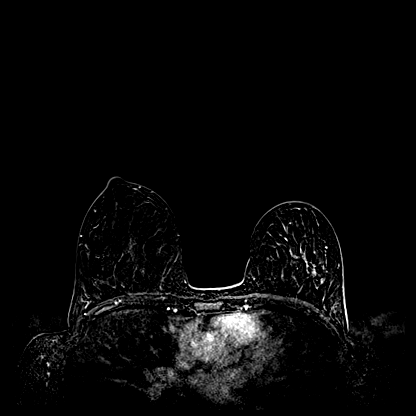
[im 103/192]
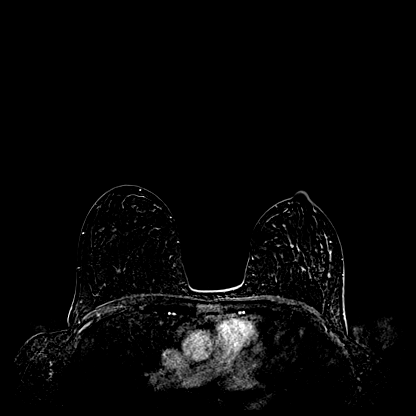
[im 162/192]
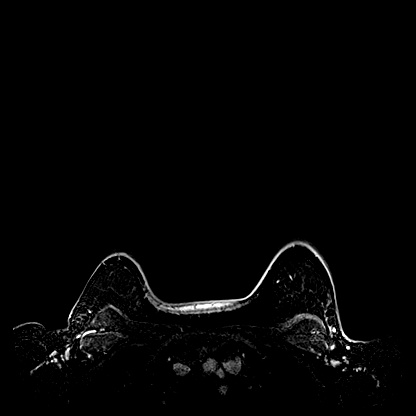

[27 of 48 positions shown; findings below may reference images not displayed]

Three-dimensional MR images were rendered by post-processing of the
original MR data on an independent workstation. The
three-dimensional MR images were interpreted, and findings are
reported in the following complete MRI report for this study. Three
dimensional images were evaluated at the independent [employer]
workstation
FINDINGS: Breast composition: b. Scattered fibroglandular tissue.

Background parenchymal enhancement: Mild.

Right breast: No suspicious mass or abnormal enhancement.

Left breast: No suspicious mass or abnormal enhancement.

Lymph nodes: No abnormal appearing lymph nodes.

Ancillary findings:  None.
IMPRESSION: No MRI evidence of malignancy in either breast.

RECOMMENDATION:
Recommend annual screening mammography. The patient is also eligible
for annual Abbreviated Breast MRI if she wishes.

BI-RADS CATEGORY  1: Negative.

## 2020-01-18 MED ORDER — GADOBUTROL 1 MMOL/ML IV SOLN
10.0000 mL | Freq: Once | INTRAVENOUS | Status: AC | PRN
  Administered 2020-01-18: 14:00:00 10 mL via INTRAVENOUS

## 2020-10-11 ENCOUNTER — Other Ambulatory Visit: Payer: Self-pay | Admitting: Obstetrics and Gynecology

## 2020-10-11 DIAGNOSIS — R922 Inconclusive mammogram: Secondary | ICD-10-CM

## 2020-10-11 DIAGNOSIS — Z9189 Other specified personal risk factors, not elsewhere classified: Secondary | ICD-10-CM

## 2021-01-25 ENCOUNTER — Other Ambulatory Visit (HOSPITAL_COMMUNITY): Payer: Self-pay

## 2021-01-27 ENCOUNTER — Other Ambulatory Visit (HOSPITAL_COMMUNITY): Payer: Self-pay

## 2021-01-27 MED ORDER — ZOLPIDEM TARTRATE 10 MG PO TABS
ORAL_TABLET | ORAL | 3 refills | Status: AC
  Filled 2021-01-27: qty 30, 30d supply, fill #0
  Filled 2021-05-23: qty 30, 30d supply, fill #1

## 2021-01-30 ENCOUNTER — Other Ambulatory Visit (HOSPITAL_COMMUNITY): Payer: Self-pay

## 2021-02-02 ENCOUNTER — Other Ambulatory Visit (HOSPITAL_COMMUNITY): Payer: Self-pay

## 2021-03-28 ENCOUNTER — Ambulatory Visit: Payer: Self-pay

## 2021-05-23 ENCOUNTER — Other Ambulatory Visit (HOSPITAL_COMMUNITY): Payer: Self-pay

## 2021-08-12 ENCOUNTER — Ambulatory Visit
Admission: RE | Admit: 2021-08-12 | Discharge: 2021-08-12 | Disposition: A | Discharge status: Home / Self Care | Payer: Self-pay | Source: Ambulatory Visit | Attending: Obstetrics and Gynecology | Admitting: Obstetrics and Gynecology

## 2021-08-12 DIAGNOSIS — Z9189 Other specified personal risk factors, not elsewhere classified: Secondary | ICD-10-CM

## 2021-08-12 IMAGING — MR MR BREAST WO/W CM  BILAT
4 of 6 series · 25 of 48 positions shown · IV contrast (7 ml gadavist)
Comparison: Bilateral breast MRI dated [DATE].
COMPARISON: Bilateral breast MRI dated [DATE].

Addendum:
CLINICAL DATA: Abbreviated Breast MRI for breast cancer screening.
Elevated lifetime risk of developing breast cancer. Family history
of breast cancer in a paternal aunt.

LABS:  None obtained on site today.
EXAM:
BILATERAL ABBREVIATED BREAST MRI WITH AND WITHOUT CONTRAST
TECHNIQUE: Multiplanar, multisequence MR images of both breasts were obtained
prior to and following the intravenous administration of 6 ml of
Gadavist

[Series 3: t2_tirm_tra ipat (a-p) · axial · 3.0mm · 0.70mm/px · z∈[-58,+110]mm · 5 of 57 slices shown]
[im 1/57]
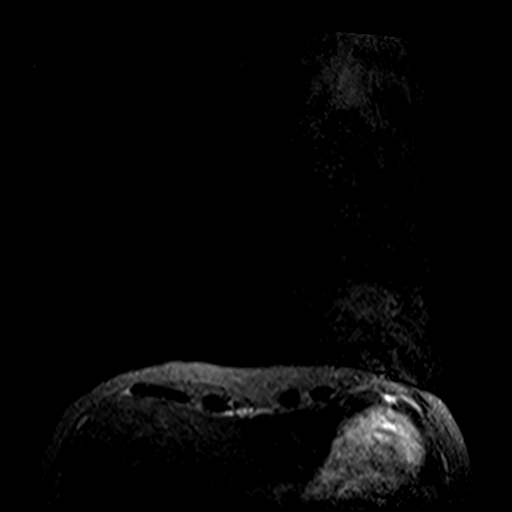
[im 15/57]
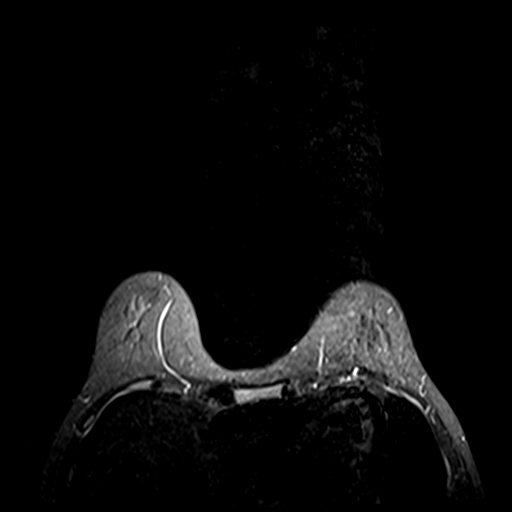
[im 29/57]
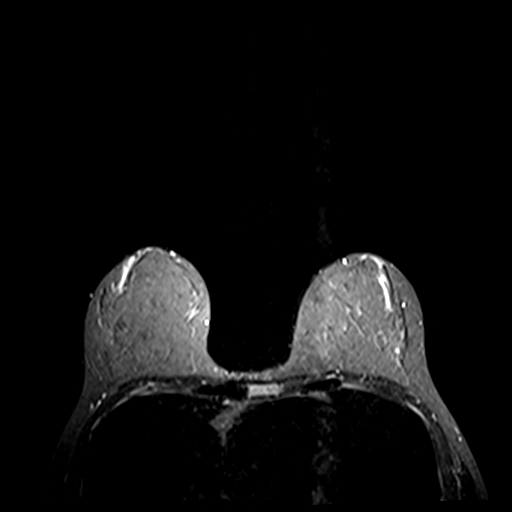
[im 43/57]
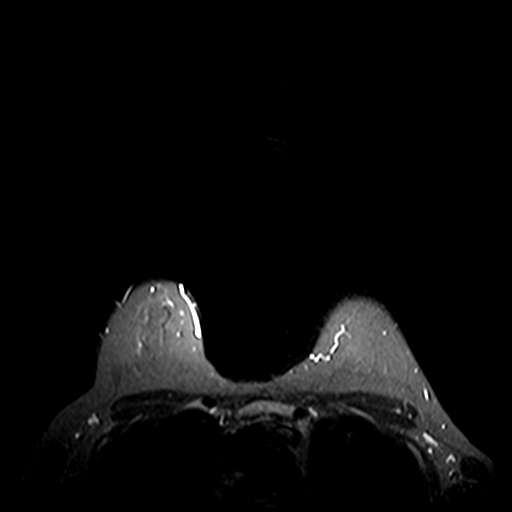
[im 57/57]
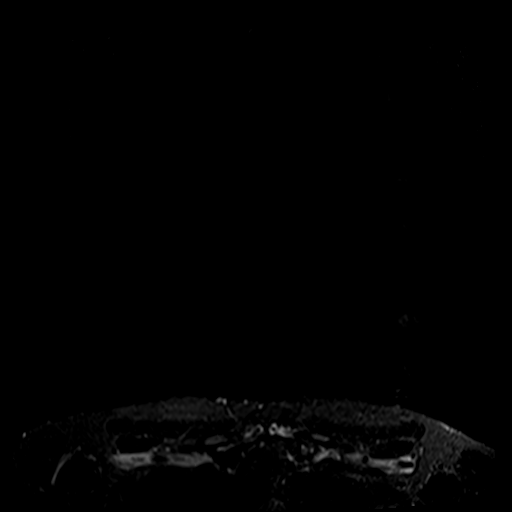

[Series 4: fl3d pre-cm · axial · non-contrast · 1.2mm · 0.94mm/px · z∈[-60,+112]mm · 9 of 144 slices shown]
[im 1/144]
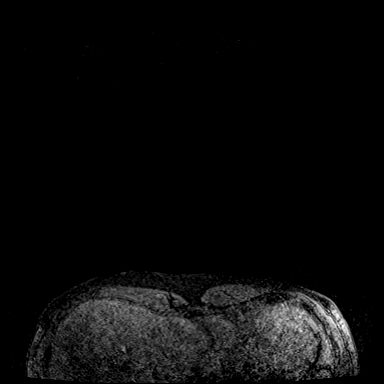
[im 24/144]
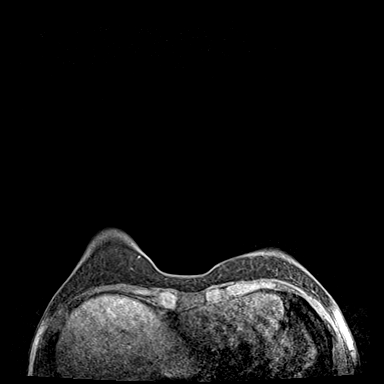
[im 48/144]
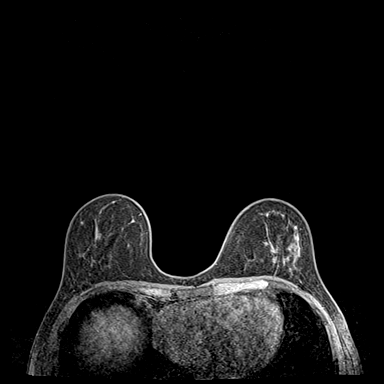
[im 60/144]
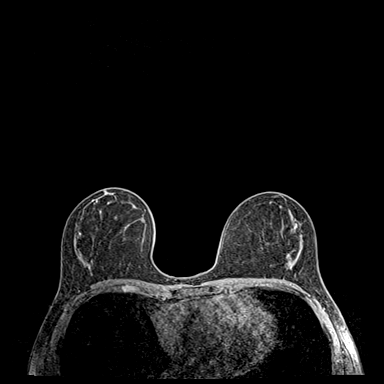
[im 72/144]
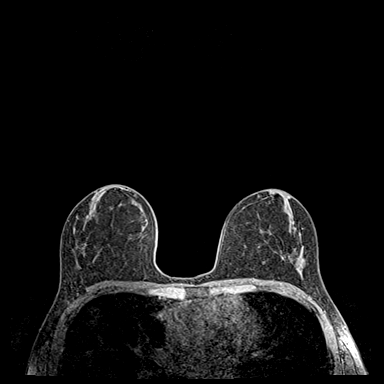
[im 84/144]
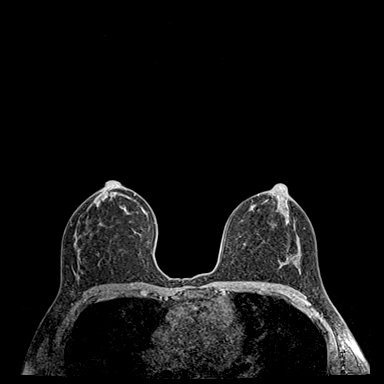
[im 96/144]
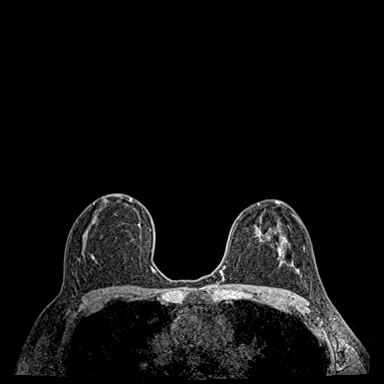
[im 120/144]
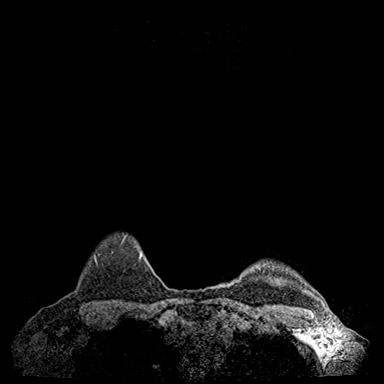
[im 144/144]
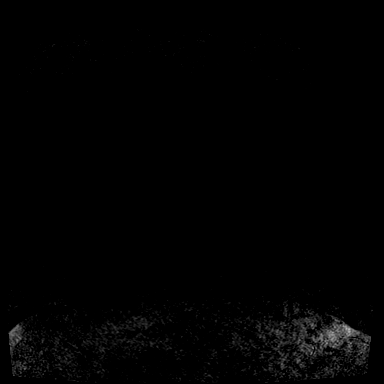

[Series 5: fl3d post-cm 20 · axial · 1.2mm · 0.94mm/px · z∈[-60,+112]mm · 8 of 144 slices shown (1 of 2)]
[im 1/144]
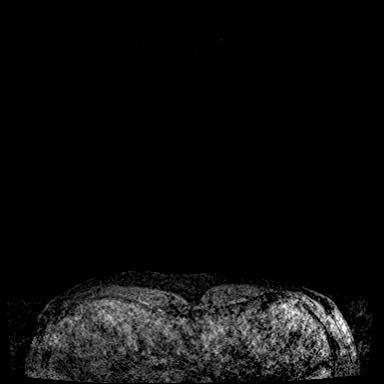
[im 23/144]
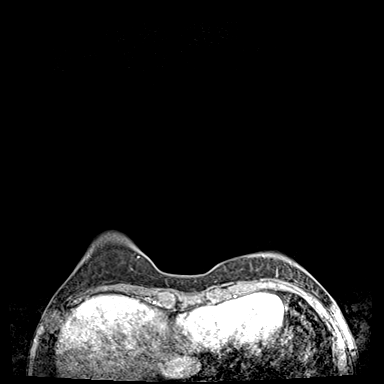
[im 45/144]
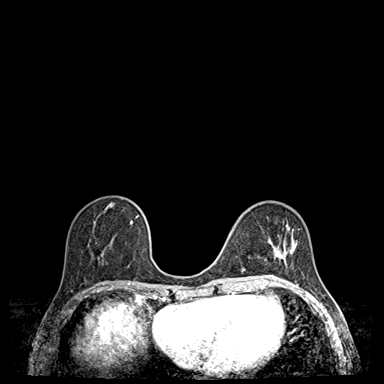
[im 67/144]
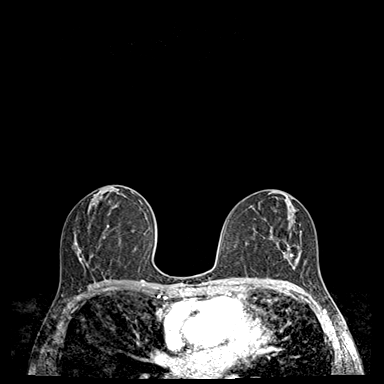
[im 78/144]
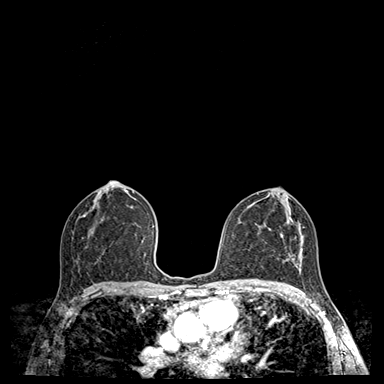
[im 100/144]
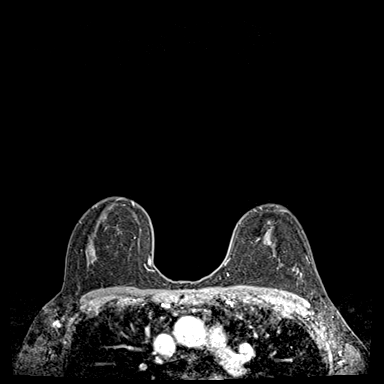
[im 122/144]
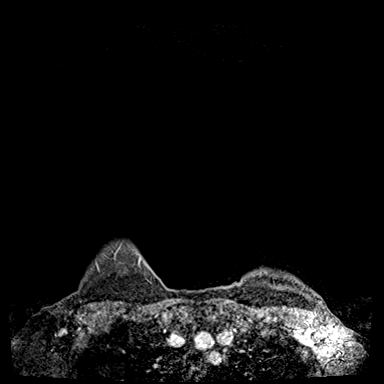
[im 144/144]
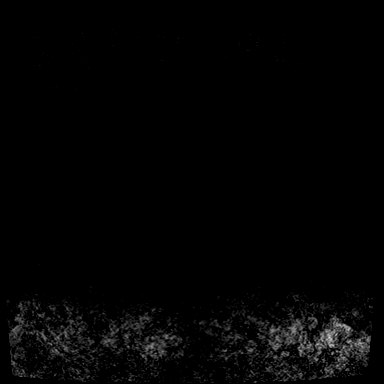

[Series 6: fl3d post-cm 20 · axial · 1.2mm · 0.94mm/px · z∈[-34,+85]mm · 3 of 144 slices shown (2 of 2)]
[im 23/144]
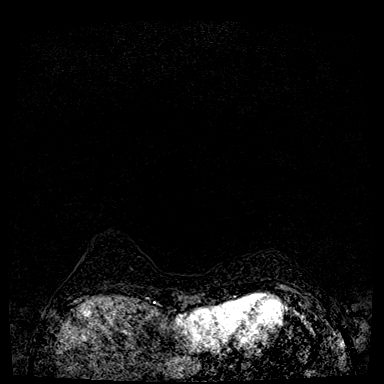
[im 78/144]
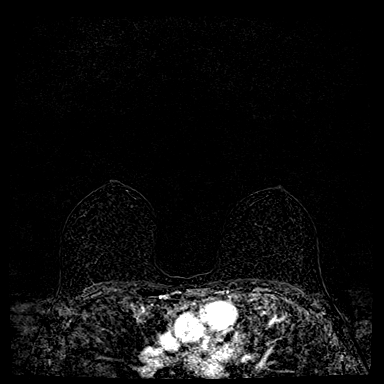
[im 122/144]
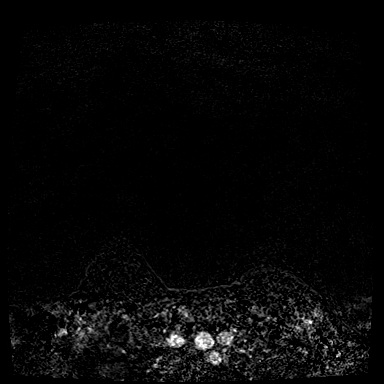

[25 of 48 positions shown; findings below may reference images not displayed]

Three-dimensional MR images were rendered by post-processing of the
original MR data on an independent workstation. The
three-dimensional MR images were interpreted, and findings are
reported in the following complete MRI report for this study. Three
dimensional images were evaluated at the independent [employer]
workstation
Right diagnostic
mammogram and ultrasound dated [DATE]. Left diagnostic mammogram
and ultrasound dated [DATE].
FINDINGS: Breast composition: b. Scattered fibroglandular tissue.

Background parenchymal enhancement: Mild

Right breast: 1.2 cm simple appearing cyst in the posterior lower
outer quadrant. No mass or abnormal enhancement suspicious for
malignancy.

Left breast: Interval small amount of loosely clumped, nodular
enhancement in the posterior lower outer quadrant of the left
breast. In the axial plane, this spans 2.7 cm in transverse
dimension. In the sagittal plane, this spans 3.7 cm in cephalo
caudal dimension and 3.2 cm in AP dimension. This is in an area of
several subcentimeter cysts.

Lymph nodes: No abnormal appearing lymph nodes.

Ancillary findings:  None.
IMPRESSION: 1. Interval small amount of loosely colonic, nodular enhancement in
the posterior lower outer quadrant of the left breast, spanning
x 3.2 x 2.7 cm. This is in an area of several subcentimeter cysts
and most likely represents enhancement associated with fibrocystic
tissue. DCIS is also possibility.
2. No evidence of malignancy elsewhere in either breast.
3. Small simple cyst in the right breast.

RECOMMENDATION:
1. Comparison within any recent mammograms if available.
2. If there are no recent mammograms available for comparison, a
bilateral diagnostic mammogram would be recommended at this time.
3. If there are no correlating suspicious mammographic
calcifications in the left breast at mammography, an MR guided left
breast biopsy would be recommended.

BI-RADS CATEGORY  4: Suspicious.

ADDENDUM:
The patient's most recent screening mamm[facility] obtained at [facility]. There are no calcifications or other findings suspicious
for malignancy in the posterior lower outer left breast at the
location of the loosely clumped, nodular enhancement seen on the
MRI. The 1st number of the impression should read loosely clumped,
not loosely colonic. A left diagnostic mammogram is recommended
prior to scheduling an MR guided core needle biopsy to evaluate for
interval calcifications suspicious for DCIS.

*** End of Addendum ***
Three-dimensional MR images were rendered by post-processing of the
original MR data on an independent workstation. The
three-dimensional MR images were interpreted, and findings are
reported in the following complete MRI report for this study. Three
dimensional images were evaluated at the independent [employer]
workstation
Right diagnostic
mammogram and ultrasound dated [DATE]. Left diagnostic mammogram
and ultrasound dated [DATE].
FINDINGS: Breast composition: b. Scattered fibroglandular tissue.

Background parenchymal enhancement: Mild

Right breast: 1.2 cm simple appearing cyst in the posterior lower
outer quadrant. No mass or abnormal enhancement suspicious for
malignancy.

Left breast: Interval small amount of loosely clumped, nodular
enhancement in the posterior lower outer quadrant of the left
breast. In the axial plane, this spans 2.7 cm in transverse
dimension. In the sagittal plane, this spans 3.7 cm in cephalo
caudal dimension and 3.2 cm in AP dimension. This is in an area of
several subcentimeter cysts.

Lymph nodes: No abnormal appearing lymph nodes.

Ancillary findings:  None.
IMPRESSION: 1. Interval small amount of loosely colonic, nodular enhancement in
the posterior lower outer quadrant of the left breast, spanning
x 3.2 x 2.7 cm. This is in an area of several subcentimeter cysts
and most likely represents enhancement associated with fibrocystic
tissue. DCIS is also possibility.
2. No evidence of malignancy elsewhere in either breast.
3. Small simple cyst in the right breast.

RECOMMENDATION:
1. Comparison within any recent mammograms if available.
2. If there are no recent mammograms available for comparison, a
bilateral diagnostic mammogram would be recommended at this time.
3. If there are no correlating suspicious mammographic
calcifications in the left breast at mammography, an MR guided left
breast biopsy would be recommended.

BI-RADS CATEGORY  4: Suspicious.

## 2021-08-12 MED ORDER — GADOBUTROL 1 MMOL/ML IV SOLN
6.0000 mL | Freq: Once | INTRAVENOUS | Status: AC | PRN
  Administered 2021-08-12: 6 mL via INTRAVENOUS

## 2021-08-15 ENCOUNTER — Other Ambulatory Visit: Payer: Self-pay | Admitting: Obstetrics and Gynecology

## 2021-08-15 DIAGNOSIS — R928 Other abnormal and inconclusive findings on diagnostic imaging of breast: Secondary | ICD-10-CM

## 2021-09-04 ENCOUNTER — Ambulatory Visit
Admission: RE | Admit: 2021-09-04 | Discharge: 2021-09-04 | Disposition: A | Discharge status: Home / Self Care | Payer: Self-pay | Source: Ambulatory Visit | Attending: Obstetrics and Gynecology | Admitting: Obstetrics and Gynecology

## 2021-09-04 ENCOUNTER — Other Ambulatory Visit: Payer: Self-pay | Admitting: Obstetrics and Gynecology

## 2021-09-04 ENCOUNTER — Ambulatory Visit
Admission: RE | Admit: 2021-09-04 | Discharge: 2021-09-04 | Disposition: A | Discharge status: Home / Self Care | Payer: No Typology Code available for payment source | Source: Ambulatory Visit | Attending: Obstetrics and Gynecology | Admitting: Obstetrics and Gynecology

## 2021-09-04 ENCOUNTER — Other Ambulatory Visit: Payer: Self-pay

## 2021-09-04 DIAGNOSIS — N644 Mastodynia: Secondary | ICD-10-CM

## 2021-09-04 DIAGNOSIS — R928 Other abnormal and inconclusive findings on diagnostic imaging of breast: Secondary | ICD-10-CM

## 2021-09-04 IMAGING — US US BREAST*L* LIMITED INC AXILLA
1 series · 4 of 4 positions shown · non-contrast
Comparison: Previous exam(s).

CLINICAL DATA: 45-year-old female who had a recent abbreviated
breast MRI [DATE]. Abnormal enhancement was seen in the lower
outer quadrant of the left breast raising the possibility of DCIS. A
diagnostic mammogram was recommended to evaluate for possible
calcifications. She also describes persistent pain in the central to
lower outer left breast.

EXAM:
DIGITAL DIAGNOSTIC UNILATERAL LEFT MAMMOGRAM WITH TOMOSYNTHESIS AND
CAD; ULTRASOUND LEFT BREAST LIMITED
TECHNIQUE: Left digital diagnostic mammography and breast tomosynthesis was
performed. The images were evaluated with computer-aided detection.;
Targeted ultrasound examination of the left breast was performed.

[Series 1: us breast*left* limited inc axilla · 0.06mm/px · 4 of 4 slices shown]
[im 1/4]
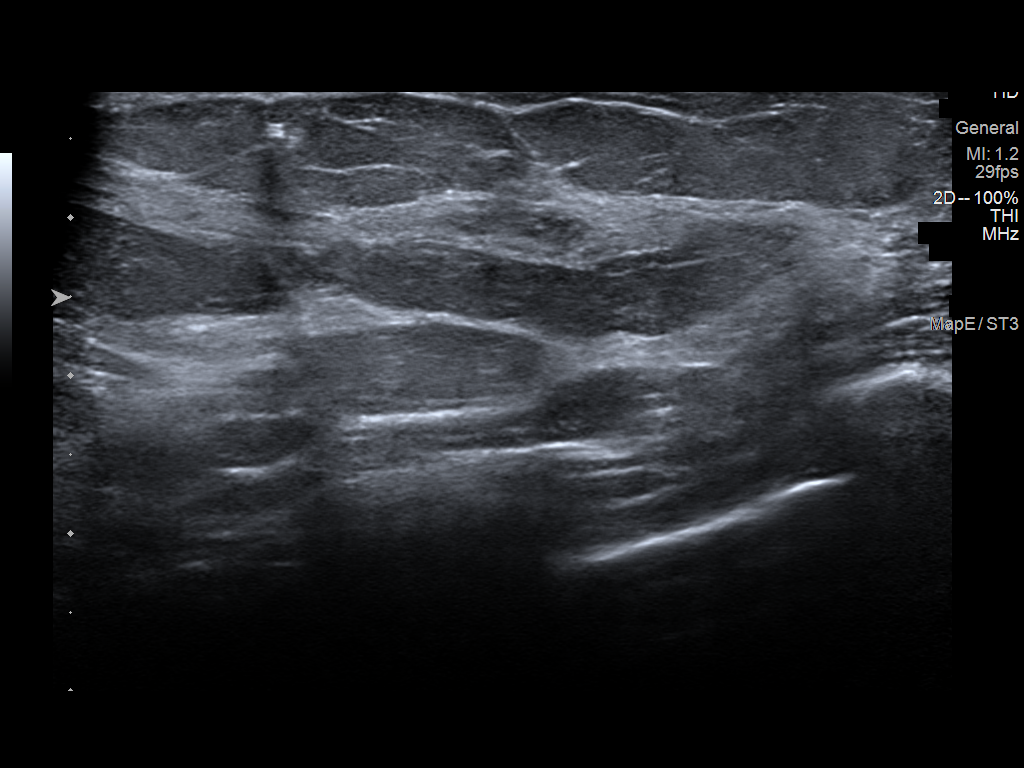
[im 2/4]
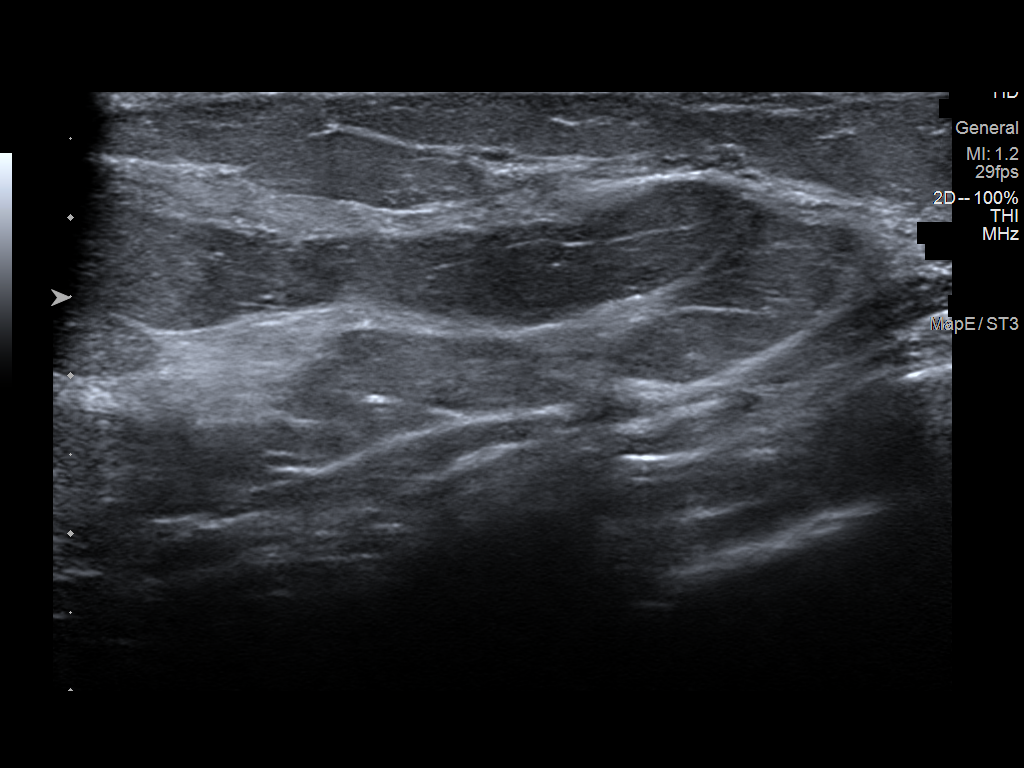
[im 3/4]
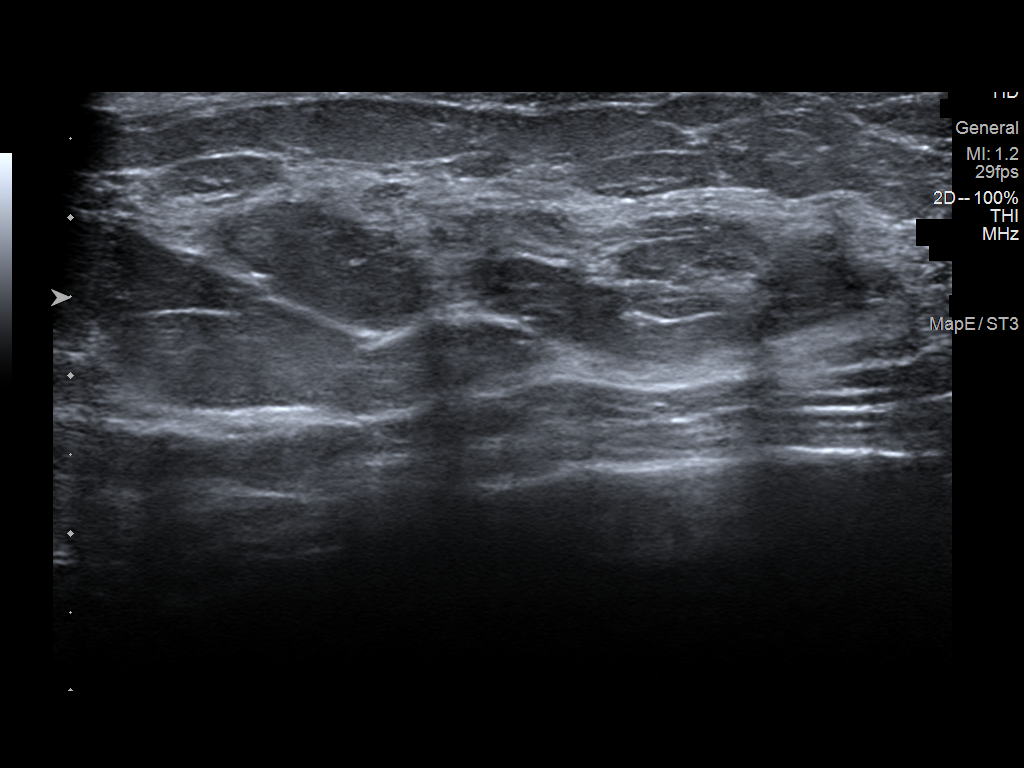
[im 4/4]
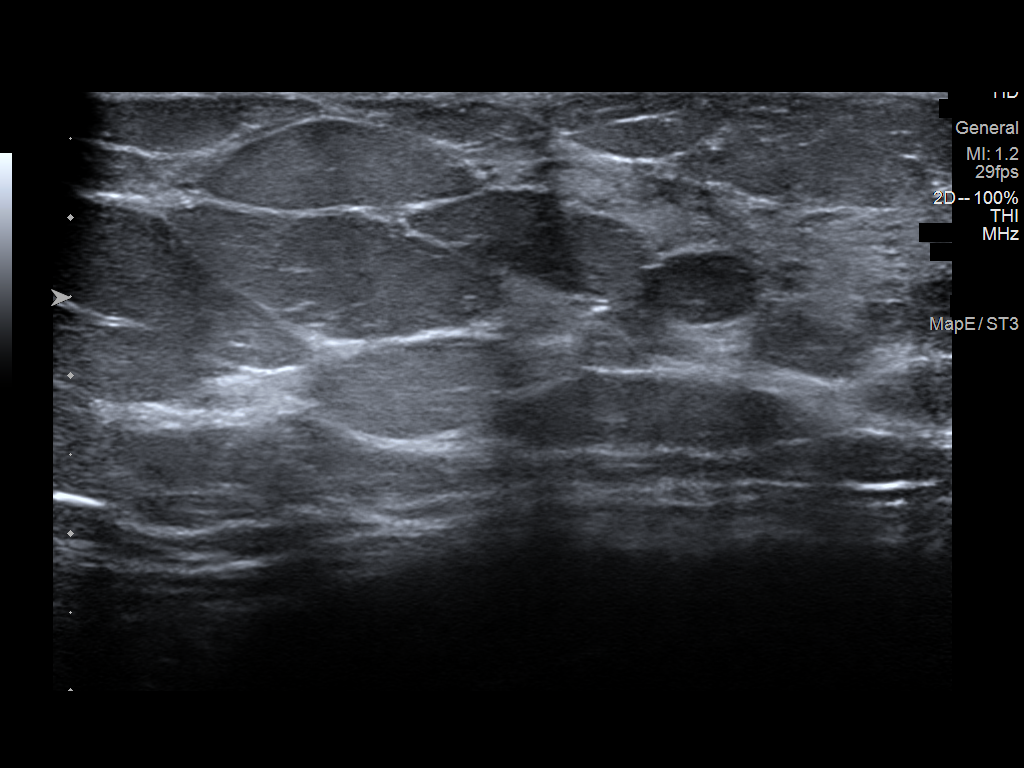

[4 of 4 positions shown; findings below may reference images not displayed]

ACR Breast Density Category c: The breast tissue is heterogeneously
dense, which may obscure small masses.
FINDINGS: Spot compression magnification images through the lower outer
quadrant of the left breast demonstrates no abnormal calcifications.
No masses or areas of distortion are seen in the left breast.

Ultrasound targeted to the central to lower outer left breast
demonstrates normal fibroglandular tissue. No suspicious masses or
areas of shadowing are identified.
IMPRESSION: 1. No mammographic abnormalities in the lower outer quadrant of the
left breast to correspond with the abnormal enhancement found on the
patient's breast MRI.

2. No suspicious mammographic or targeted sonographic abnormalities
in the lower outer to central left breast to correspond with the
area of the patient's pain.

RECOMMENDATION:
1. MRI guided biopsy as described in the MRI report from [DATE]
is recommended.

2. Clinical follow-up recommended for the tender area of concern in
the lower outer left breast. Any further workup should be based on
clinical grounds.

I have discussed the findings and recommendations with the patient.
If applicable, a reminder letter will be sent to the patient
regarding the next appointment.

BI-RADS CATEGORY  1: Negative.

## 2021-09-05 ENCOUNTER — Other Ambulatory Visit: Payer: Self-pay | Admitting: Obstetrics and Gynecology

## 2021-09-05 DIAGNOSIS — R9389 Abnormal findings on diagnostic imaging of other specified body structures: Secondary | ICD-10-CM

## 2021-09-06 ENCOUNTER — Other Ambulatory Visit: Payer: Self-pay

## 2021-09-06 ENCOUNTER — Ambulatory Visit
Admission: RE | Admit: 2021-09-06 | Discharge: 2021-09-06 | Disposition: A | Discharge status: Home / Self Care | Payer: No Typology Code available for payment source | Source: Ambulatory Visit | Attending: Obstetrics and Gynecology | Admitting: Obstetrics and Gynecology

## 2021-09-06 ENCOUNTER — Other Ambulatory Visit (HOSPITAL_COMMUNITY): Payer: Self-pay | Admitting: Diagnostic Radiology

## 2021-09-06 DIAGNOSIS — R9389 Abnormal findings on diagnostic imaging of other specified body structures: Secondary | ICD-10-CM

## 2021-09-06 IMAGING — MR MR BREAST BX W LOC DEV 1ST LESION IMAGE BX SPEC MR GUIDE*L*
7 of 10 series · 33 of 48 positions shown · IV contrast (6 ml gadavist)
Comparison: Previous exams.
COMPARISON: Previous exams.

Addendum:
CLINICAL DATA: Clumped non masslike enhancement seen in the lower
outer quadrant of the left breast. MR guided core biopsy
recommended.

EXAM:
MRI GUIDED CORE NEEDLE BIOPSY OF THE LEFT BREAST
TECHNIQUE: Multiplanar, multisequence MR imaging of the breast was performed
both before and after administration of intravenous contrast.
CONTRAST:  6mL GADAVIST GADOBUTROL 1 MMOL/ML IV SOLN

[Series 2: fiducial unilateral · sagittal · 2.0mm · 1.33mm/px · 3 of 52 slices shown]
[im 1/52]
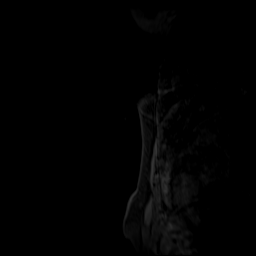
[im 26/52]
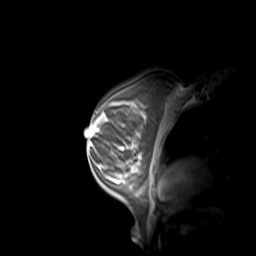
[im 52/52]
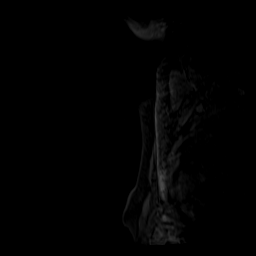

[Series 3: dynamic pre · axial · non-contrast · 1.3mm · 0.73mm/px · z∈[-102,+84]mm · 5 of 144 slices shown]
[im 1/144]
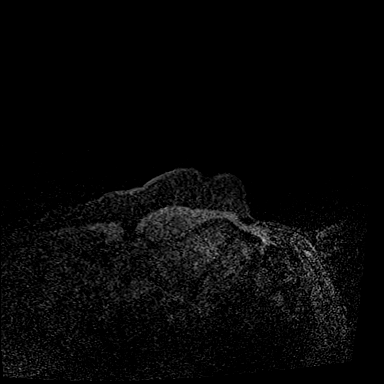
[im 36/144]
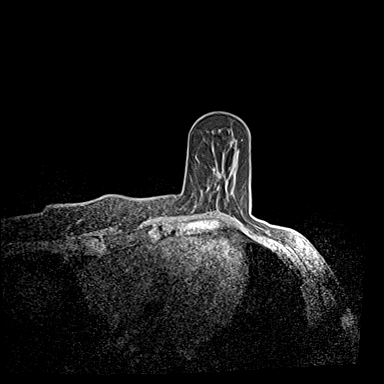
[im 72/144]
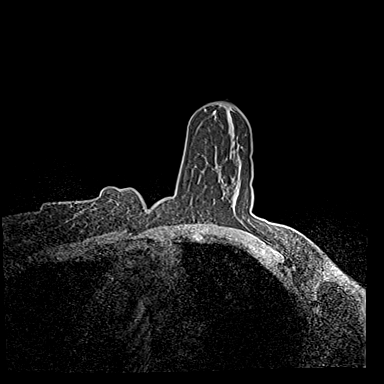
[im 108/144]
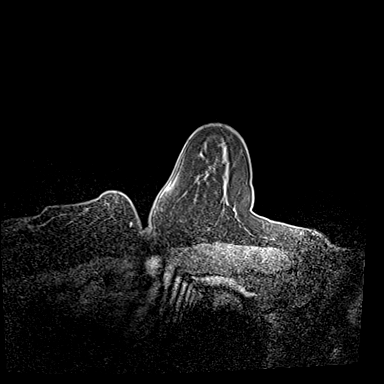
[im 144/144]
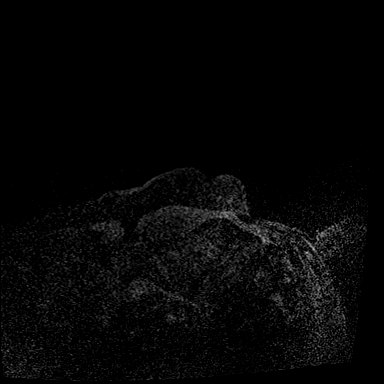

[Series 4: dynamic post 20 · axial · 1.3mm · 0.73mm/px · z∈[-102,+84]mm · 5 of 144 slices shown (1 of 2)]
[im 1/144]
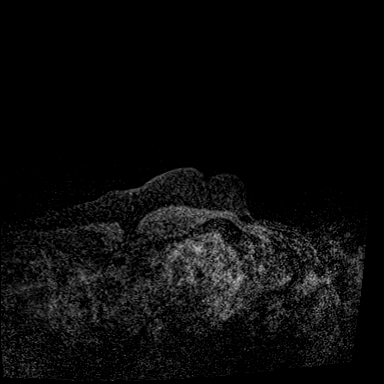
[im 36/144]
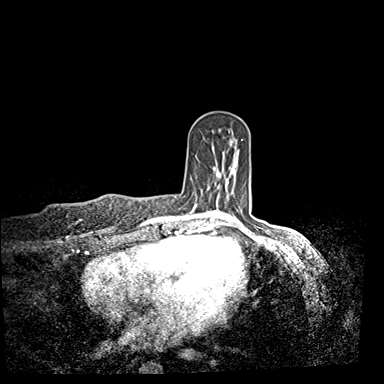
[im 72/144]
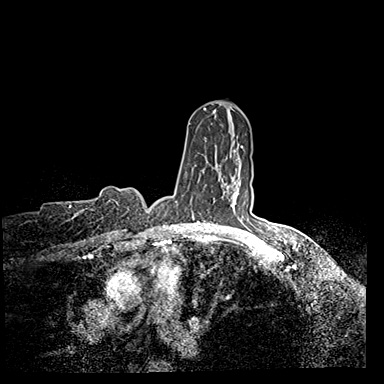
[im 108/144]
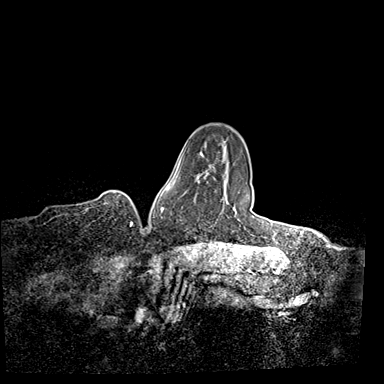
[im 144/144]
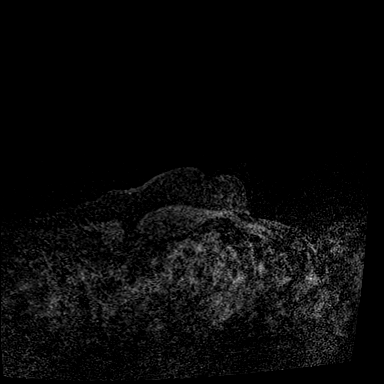

[Series 5: dynamic post 20 · axial · 1.3mm · 0.73mm/px · z∈[-102,+84]mm · 5 of 144 slices shown (2 of 2)]
[im 1/144]
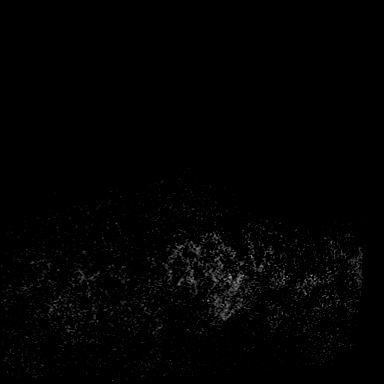
[im 36/144]
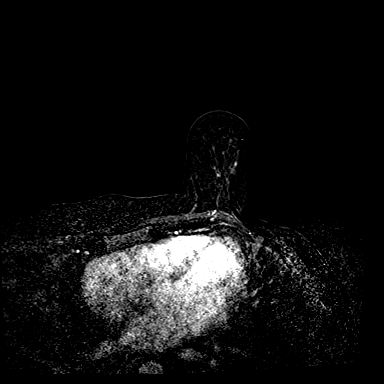
[im 72/144]
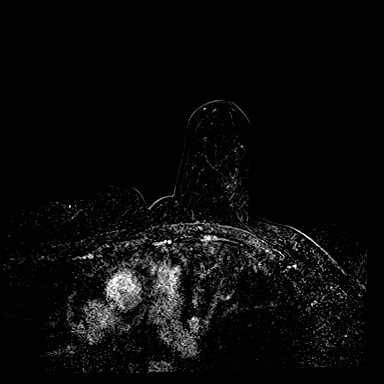
[im 108/144]
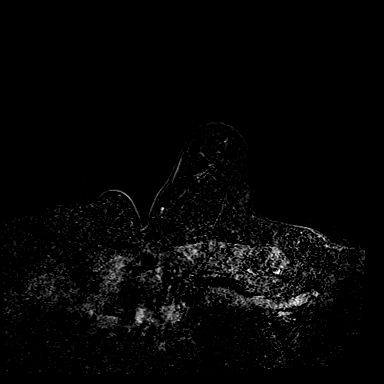
[im 144/144]
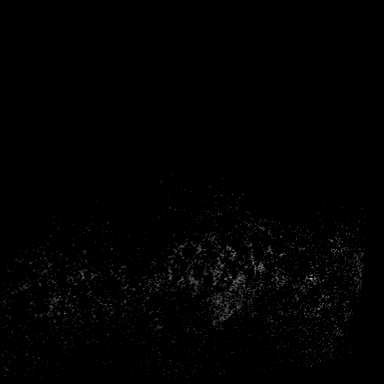

[Series 6: dynamic post 3 · axial · 1.3mm · 0.73mm/px · z∈[-102,+84]mm · 5 of 144 slices shown (1 of 2)]
[im 1/144]
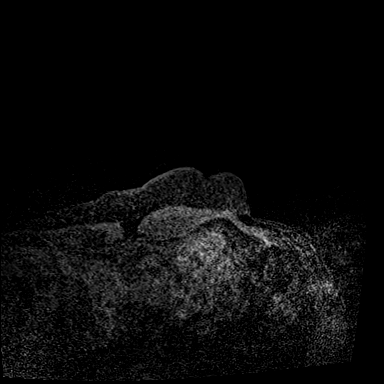
[im 36/144]
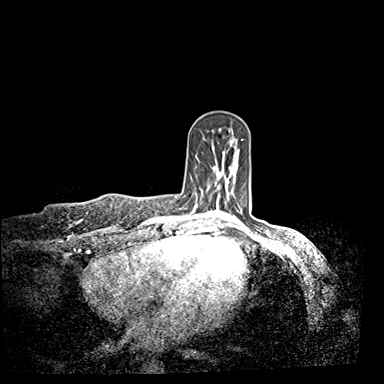
[im 72/144]
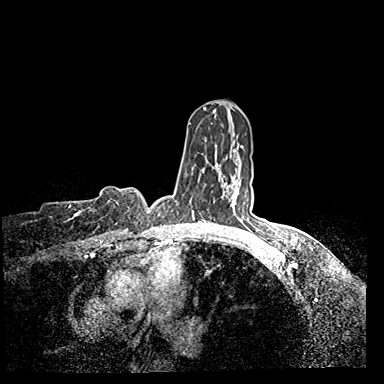
[im 108/144]
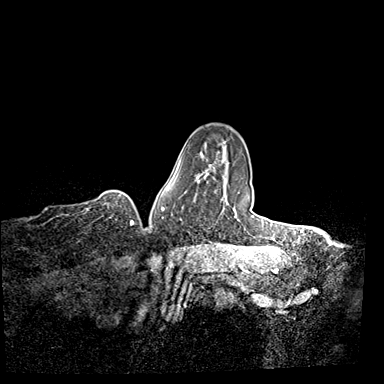
[im 144/144]
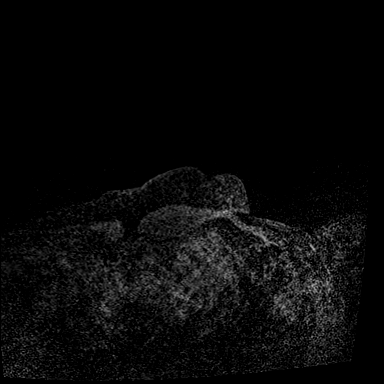

[Series 7: dynamic post 3 · axial · 1.3mm · 0.73mm/px · z∈[-102,+84]mm · 5 of 144 slices shown (2 of 2)]
[im 1/144]
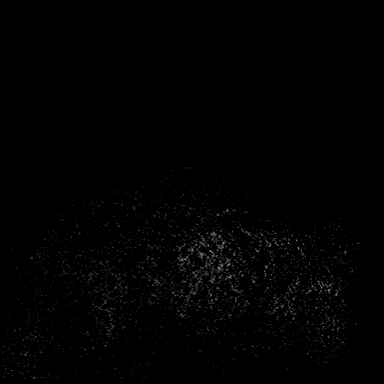
[im 36/144]
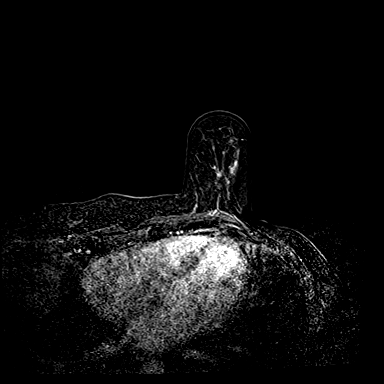
[im 72/144]
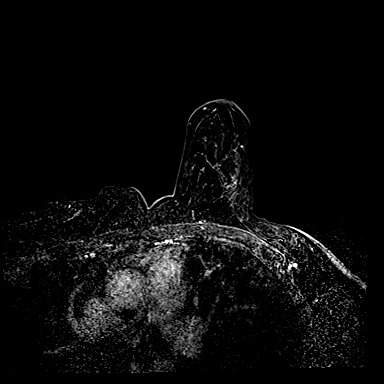
[im 108/144]
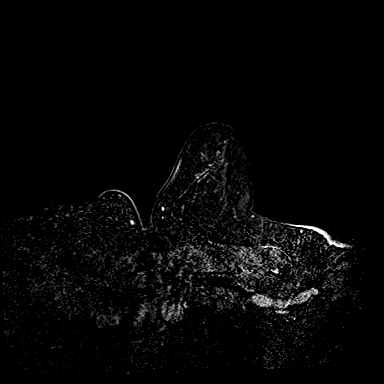
[im 144/144]
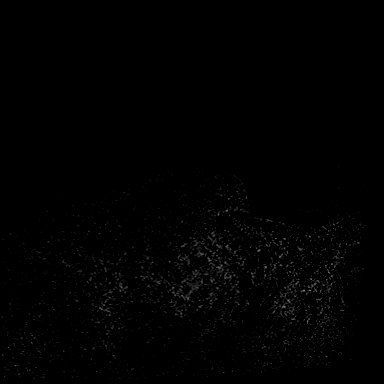

[Series 8: needle confirmation · axial · 1.3mm · 0.73mm/px · z∈[-102,+84]mm · 5 of 144 slices shown]
[im 1/144]
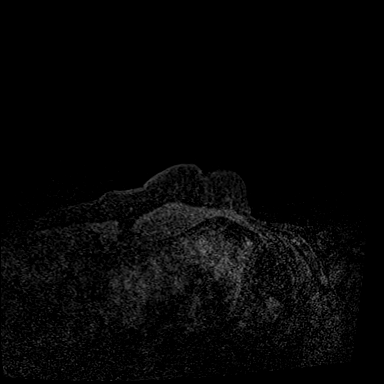
[im 36/144]
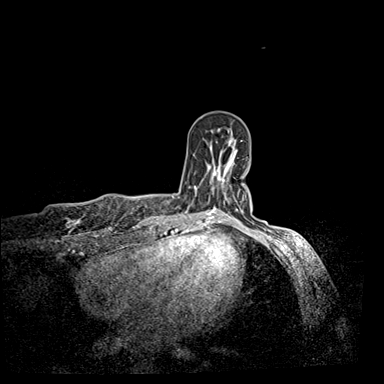
[im 72/144]
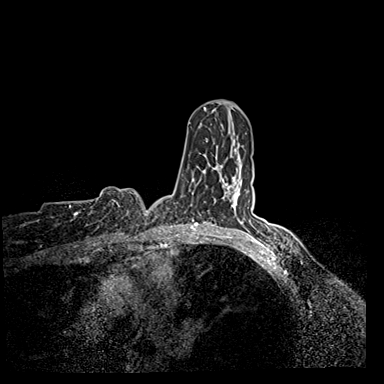
[im 108/144]
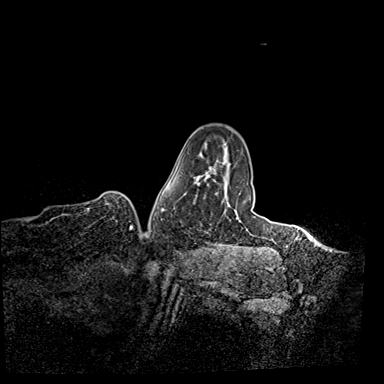
[im 144/144]
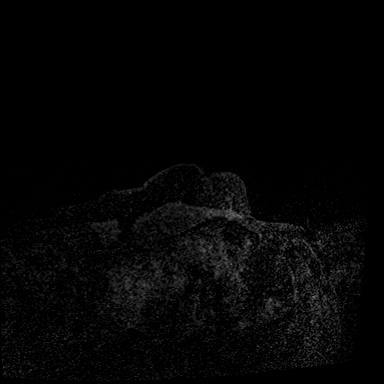

[33 of 48 positions shown; findings below may reference images not displayed]

FINDINGS: I met with the patient, and we discussed the procedure of MRI guided
biopsy, including risks, benefits, and alternatives. Specifically,
we discussed the risks of infection, bleeding, tissue injury, clip
migration, and inadequate sampling. Informed, written consent was
given. The usual time out protocol was performed immediately prior
to the procedure.

Using sterile technique, 1% Lidocaine, MRI guidance, and a 9 gauge
vacuum assisted device, biopsy was performed of enhancement in the
lower outer quadrant of the left breast using a lateral to medial
approach. At the conclusion of the procedure, a dumbbell-shaped
tissue marker clip was deployed into the biopsy cavity. Follow-up
2-view mammogram was performed and dictated separately.
IMPRESSION: MRI guided biopsy of the left breast.  No apparent complications.

ADDENDUM:
Pathology revealed COLUMNAR CELL AND FIBROCYSTIC CHANGES- NO
MALIGNANCY IDENTIFIED of the LEFT breast, lower outer quadrant
(dumbbell-shaped clip). This was found to be concordant by Dr. KULWINDER
KULWINDER.

Pathology results were discussed with the patient by telephone. The
patient reported doing well after the biopsy with tenderness at the
site. Post biopsy instructions and care were reviewed and questions
were answered. The patient was encouraged to call The [REDACTED]

Bilateral breast MRI recommended in 6 months per protocol.

Pathology results reported by KULWINDER RN on [DATE].

*** End of Addendum ***
FINDINGS: I met with the patient, and we discussed the procedure of MRI guided
biopsy, including risks, benefits, and alternatives. Specifically,
we discussed the risks of infection, bleeding, tissue injury, clip
migration, and inadequate sampling. Informed, written consent was
given. The usual time out protocol was performed immediately prior
to the procedure.

Using sterile technique, 1% Lidocaine, MRI guidance, and a 9 gauge
vacuum assisted device, biopsy was performed of enhancement in the
lower outer quadrant of the left breast using a lateral to medial
approach. At the conclusion of the procedure, a dumbbell-shaped
tissue marker clip was deployed into the biopsy cavity. Follow-up
2-view mammogram was performed and dictated separately.
IMPRESSION: MRI guided biopsy of the left breast.  No apparent complications.

## 2021-09-06 MED ORDER — GADOBUTROL 1 MMOL/ML IV SOLN
6.0000 mL | Freq: Once | INTRAVENOUS | Status: AC | PRN
  Administered 2021-09-06: 09:00:00 6 mL via INTRAVENOUS

## 2021-09-19 ENCOUNTER — Other Ambulatory Visit (HOSPITAL_COMMUNITY): Payer: Self-pay

## 2021-09-20 ENCOUNTER — Other Ambulatory Visit (HOSPITAL_COMMUNITY): Payer: Self-pay

## 2021-09-25 ENCOUNTER — Other Ambulatory Visit: Payer: No Typology Code available for payment source

## 2022-01-19 ENCOUNTER — Other Ambulatory Visit: Payer: Self-pay | Admitting: Obstetrics and Gynecology

## 2022-01-19 DIAGNOSIS — Z9189 Other specified personal risk factors, not elsewhere classified: Secondary | ICD-10-CM

## 2022-03-06 ENCOUNTER — Ambulatory Visit
Admission: RE | Admit: 2022-03-06 | Discharge: 2022-03-06 | Disposition: A | Discharge status: Home / Self Care | Payer: No Typology Code available for payment source | Source: Ambulatory Visit | Attending: Obstetrics and Gynecology | Admitting: Obstetrics and Gynecology

## 2022-03-06 DIAGNOSIS — Z9189 Other specified personal risk factors, not elsewhere classified: Secondary | ICD-10-CM

## 2022-03-06 IMAGING — MR MR BREAST BILAT WO/W CM
8 of 13 series · 31 of 48 positions shown · IV contrast (7ml gadavist)
Comparison: Previous exams including breast MRI dated [DATE],
MRI-guided breast biopsy dated [DATE], abbreviated breast MRI
dated [DATE], and most recent bilateral screening mammogram
dated [DATE].

CLINICAL DATA: High risk for breast cancer. Family history of
breast cancer. Intermittent LEFT breast pain for 1 year around and
posterior to the nipple. Benign MRI-guided LEFT breast biopsy in
[DATE] with pathology result of benign columnar cell and
fibrocystic changes.

EXAM:
BILATERAL BREAST MRI WITH AND WITHOUT CONTRAST
TECHNIQUE: Multiplanar, multisequence MR images of both breasts were obtained
prior to and following the intravenous administration of 7 ml of
Gadavist

[Series 2: t2_tirm_tra ipat (a-p) · axial · 3.0mm · 0.70mm/px · 1 of 55 slices shown]
[im 1/55]
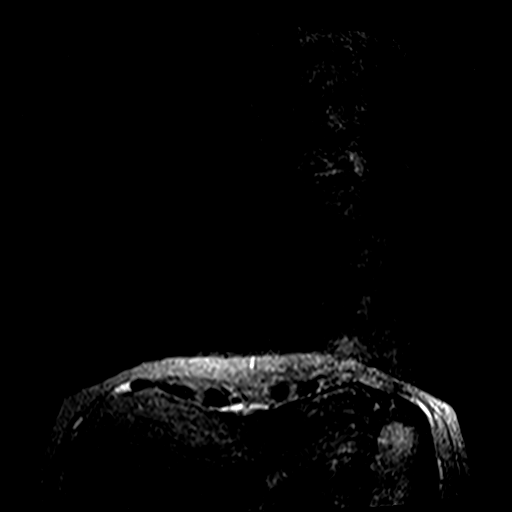

[Series 3: fl3d pre-cm no · axial · non-contrast · 1.2mm · 0.89mm/px · z∈[-86,+86]mm · 5 of 144 slices shown]
[im 1/144]
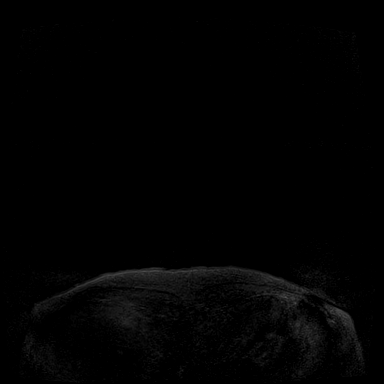
[im 36/144]
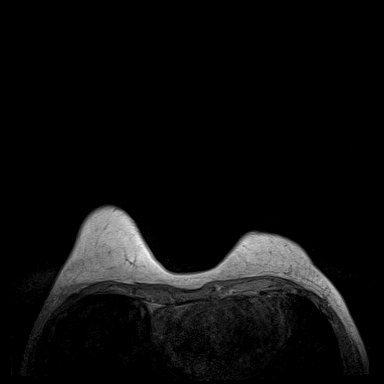
[im 72/144]
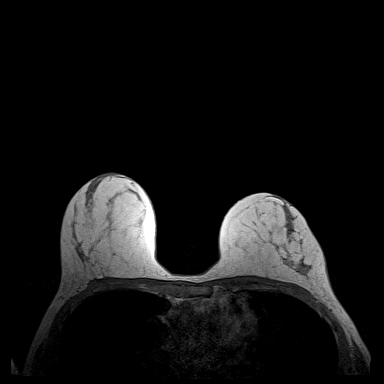
[im 108/144]
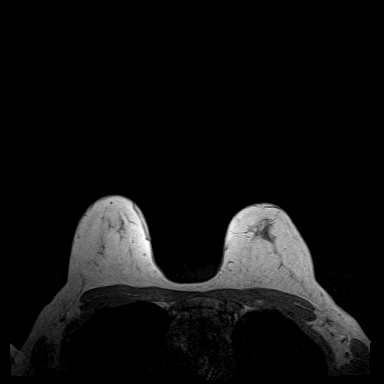
[im 144/144]
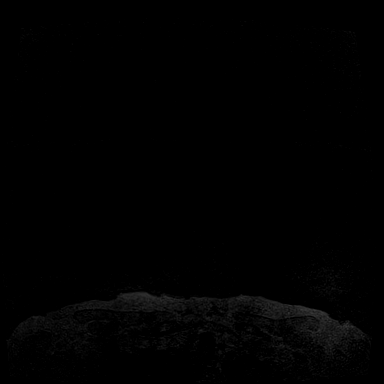

[Series 4: fl3d pre-cm · axial · non-contrast · 1.2mm · 0.89mm/px · z∈[-86,+86]mm · 5 of 144 slices shown]
[im 1/144]
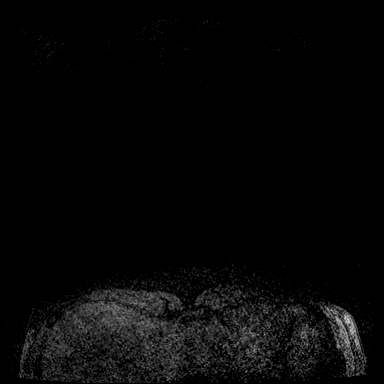
[im 36/144]
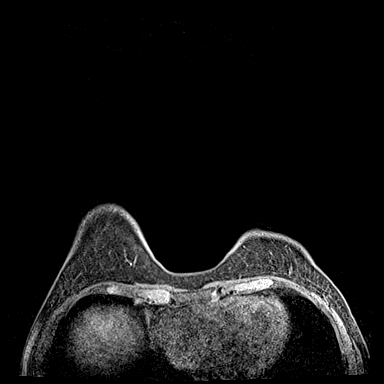
[im 72/144]
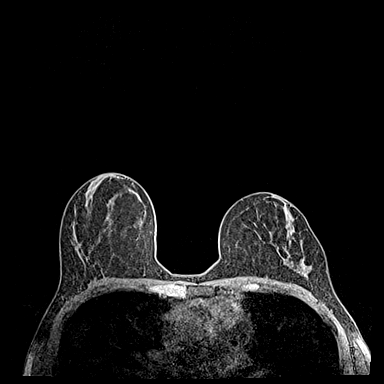
[im 108/144]
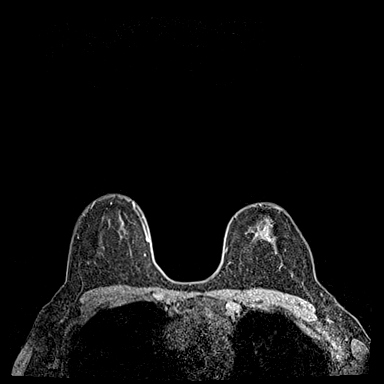
[im 144/144]
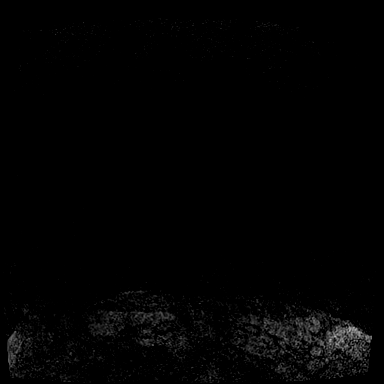

[Series 5: fl3d post-cm 20 · axial · 1.2mm · 0.89mm/px · z∈[-86,+86]mm · 5 of 144 slices shown (1 of 3)]
[im 1/144]
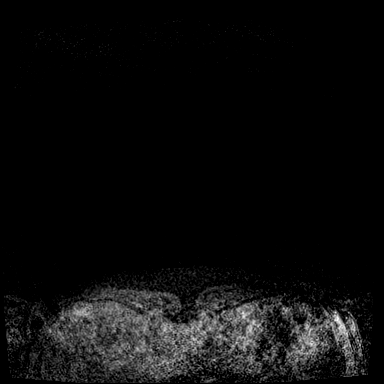
[im 36/144]
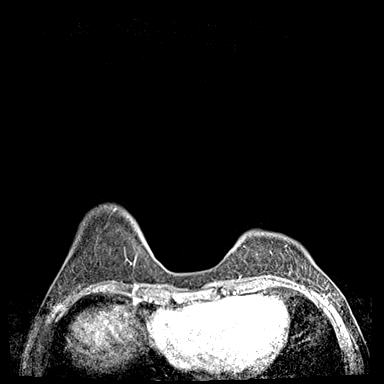
[im 72/144]
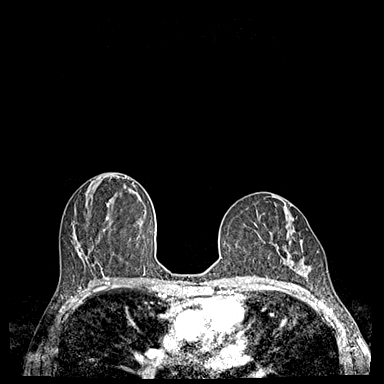
[im 108/144]
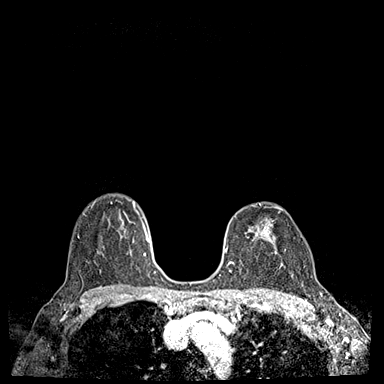
[im 144/144]
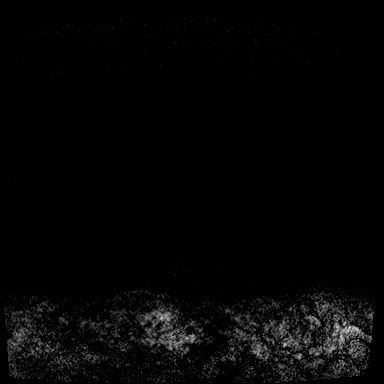

[Series 6: fl3d post-cm 20 · axial · 1.2mm · 0.89mm/px · z∈[-86,+86]mm · 5 of 144 slices shown (2 of 3)]
[im 1/144]
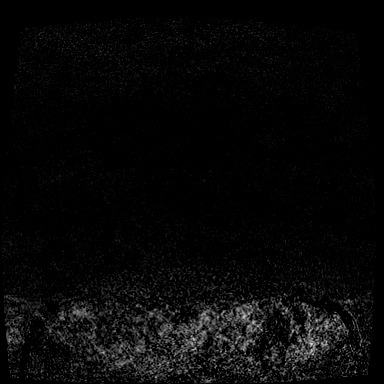
[im 36/144]
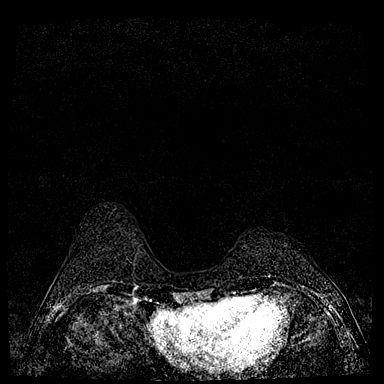
[im 72/144]
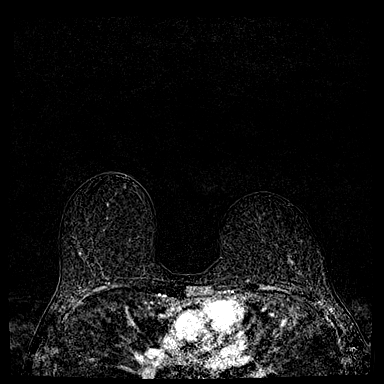
[im 108/144]
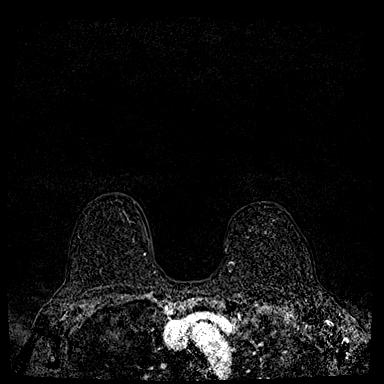
[im 144/144]
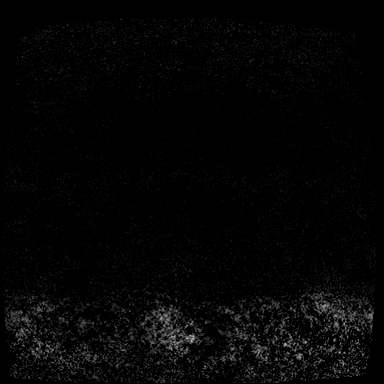

[Series 7: fl3d post-cm 20 · axial · 172.8mm · 0.89mm/px · 1 of 1 slices shown (3 of 3)]
[im 1/1]
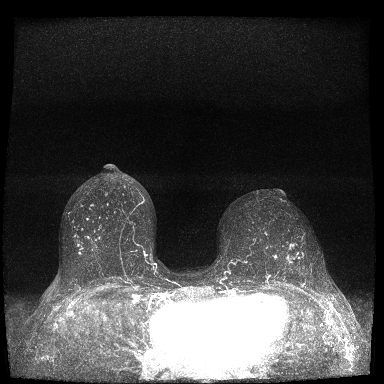

[Series 8: fl3d post-cm 3 · axial · 1.2mm · 0.89mm/px · z∈[-86,+86]mm · 5 of 144 slices shown (1 of 2)]
[im 1/144]
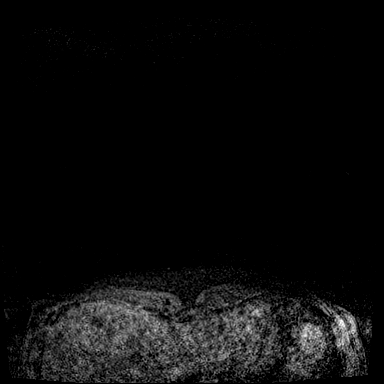
[im 36/144]
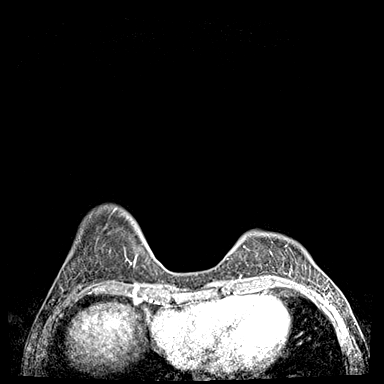
[im 72/144]
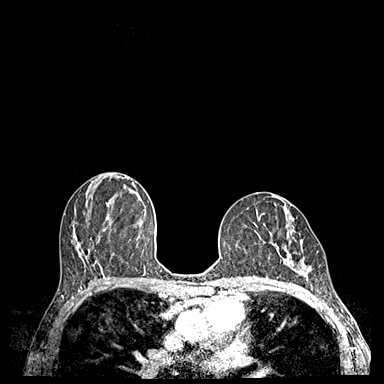
[im 108/144]
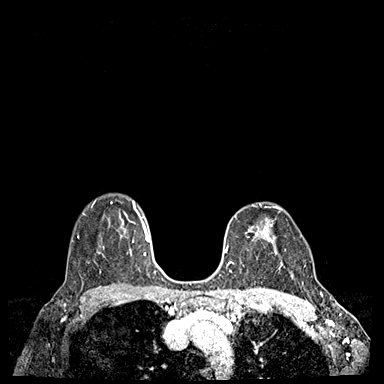
[im 144/144]
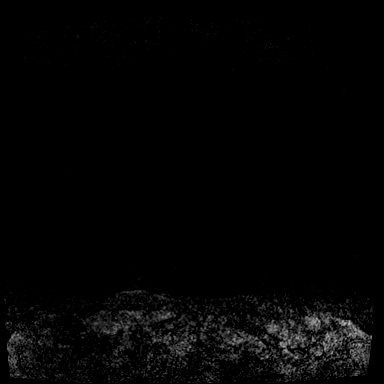

[Series 9: fl3d post-cm 3 · axial · 1.2mm · 0.89mm/px · z∈[-86,+16]mm · 4 of 144 slices shown (2 of 2)]
[im 1/144]
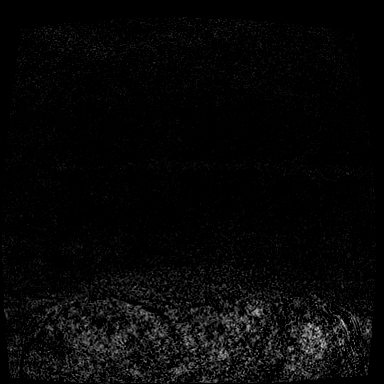
[im 29/144]
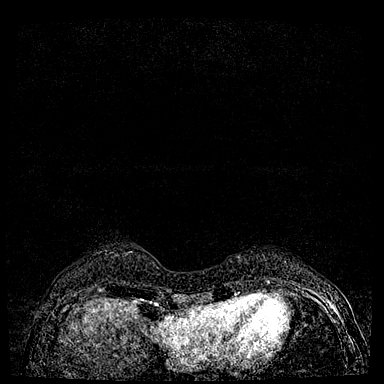
[im 58/144]
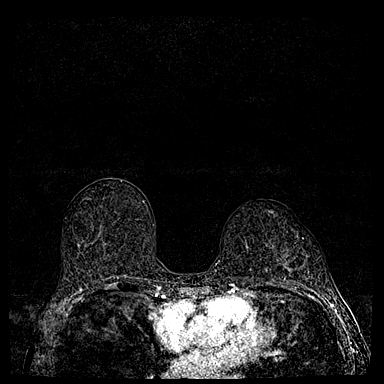
[im 86/144]
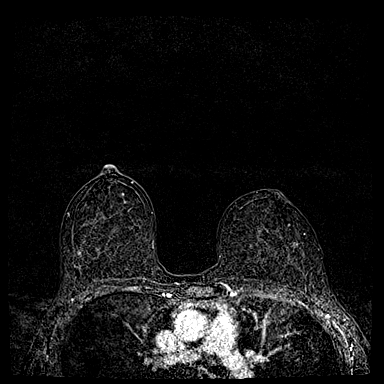

[31 of 48 positions shown; findings below may reference images not displayed]

Three-dimensional MR images were rendered by post-processing of the
original MR data on an independent workstation. The
three-dimensional MR images were interpreted, and findings are
reported in the following complete MRI report for this study. Three
dimensional images were evaluated at the independent interpreting
workstation using the [employer] thin client.
FINDINGS: Breast composition: b. Scattered fibroglandular tissue.

Background parenchymal enhancement: Moderate.

Right breast: No suspicious enhancing mass, suspicious non-mass
enhancement or secondary signs of malignancy are identified within
the RIGHT breast.

Left breast: Clumped non-mass enhancement within the lower outer
quadrant of the LEFT breast, measuring 3.8 x 1.6 x 3.9 cm
(transverse by AP by craniocaudal dimensions), with mixed plateau
and progressive enhancement kinetics (series 9, images 75 through
99). Biopsy clip artifact is seen slightly superficial to the
inferior component of this non-mass enhancement.

No suspicious enhancing mass, suspicious non-mass enhancement or
secondary signs of malignancy are identified elsewhere within the
LEFT breast.

Specifically, no suspicious enhancement near the LEFT nipple,
corresponding to patient's area of pain.

Lymph nodes: No abnormal appearing lymph nodes.

Ancillary findings:  None.
IMPRESSION: 1. Clumped non-mass enhancement within the lower outer quadrant of
the LEFT breast, measuring 3.9 cm craniocaudal dimension and 3.8 cm
transverse dimension, with mixed plateau and progressive enhancement
kinetics (series 9, images 75-99). Recommend 2 site MRI-guided
biopsies to include the superior and inferior aspects of this
non-mass enhancement. Biopsy clip from a previous MRI-guided biopsy
is located slightly away from this non-mass enhancement.
2. No evidence of malignancy near the LEFT nipple, corresponding to
patient's area of pain.
3. No evidence of malignancy within the RIGHT breast.

RECOMMENDATION:
MRI-guided biopsies (2 sites) for the non-mass enhancement within
the lower outer quadrant of the LEFT breast, with targeting of the
most superior and inferior components.

BI-RADS CATEGORY  4: Suspicious.

## 2022-03-06 MED ORDER — GADOBUTROL 1 MMOL/ML IV SOLN
7.0000 mL | Freq: Once | INTRAVENOUS | Status: AC | PRN
  Administered 2022-03-06: 7 mL via INTRAVENOUS

## 2022-03-07 ENCOUNTER — Other Ambulatory Visit: Payer: Self-pay | Admitting: Obstetrics and Gynecology

## 2022-03-07 DIAGNOSIS — R928 Other abnormal and inconclusive findings on diagnostic imaging of breast: Secondary | ICD-10-CM

## 2022-03-12 ENCOUNTER — Ambulatory Visit
Admission: RE | Admit: 2022-03-12 | Discharge: 2022-03-12 | Disposition: A | Discharge status: Home / Self Care | Payer: No Typology Code available for payment source | Source: Ambulatory Visit | Attending: Obstetrics and Gynecology | Admitting: Obstetrics and Gynecology

## 2022-03-12 DIAGNOSIS — R928 Other abnormal and inconclusive findings on diagnostic imaging of breast: Secondary | ICD-10-CM

## 2022-03-12 MED ORDER — GADOBUTROL 1 MMOL/ML IV SOLN
7.0000 mL | Freq: Once | INTRAVENOUS | Status: AC | PRN
Start: 2022-03-12 — End: 2022-03-12
  Administered 2022-03-12: 7 mL via INTRAVENOUS

## 2022-05-09 ENCOUNTER — Other Ambulatory Visit: Payer: Self-pay | Admitting: Family Medicine

## 2022-08-08 ENCOUNTER — Other Ambulatory Visit: Payer: Self-pay | Admitting: Obstetrics and Gynecology

## 2022-08-08 DIAGNOSIS — Z9189 Other specified personal risk factors, not elsewhere classified: Secondary | ICD-10-CM

## 2022-08-29 ENCOUNTER — Ambulatory Visit
Admission: RE | Admit: 2022-08-29 | Discharge: 2022-08-29 | Disposition: A | Discharge status: Home / Self Care | Payer: No Typology Code available for payment source | Source: Ambulatory Visit | Attending: Obstetrics and Gynecology | Admitting: Obstetrics and Gynecology

## 2022-08-29 DIAGNOSIS — Z9189 Other specified personal risk factors, not elsewhere classified: Secondary | ICD-10-CM

## 2022-08-29 MED ORDER — GADOPICLENOL 0.5 MMOL/ML IV SOLN
6.0000 mL | Freq: Once | INTRAVENOUS | Status: AC | PRN
  Administered 2022-08-29: 6 mL via INTRAVENOUS
